# Patient Record
Sex: Male | Born: 1950 | Race: Black or African American | Hispanic: No | Marital: Married | Smoking: Former smoker
Health system: Southern US, Community
[De-identification: ages and names within clinical notes are randomized; demographics above are authoritative.]

## PROBLEM LIST (undated history)

## (undated) DIAGNOSIS — E782 Mixed hyperlipidemia: Secondary | ICD-10-CM

## (undated) DIAGNOSIS — T884XXA Failed or difficult intubation, initial encounter: Secondary | ICD-10-CM

## (undated) DIAGNOSIS — D649 Anemia, unspecified: Secondary | ICD-10-CM

## (undated) DIAGNOSIS — I10 Essential (primary) hypertension: Secondary | ICD-10-CM

## (undated) DIAGNOSIS — R739 Hyperglycemia, unspecified: Secondary | ICD-10-CM

## (undated) DIAGNOSIS — K635 Polyp of colon: Secondary | ICD-10-CM

## (undated) DIAGNOSIS — H409 Unspecified glaucoma: Secondary | ICD-10-CM

## (undated) HISTORY — DX: Mixed hyperlipidemia: E78.2

## (undated) HISTORY — DX: Essential (primary) hypertension: I10

## (undated) HISTORY — PX: NO PAST SURGERIES: SHX2092

## (undated) HISTORY — DX: Polyp of colon: K63.5

## (undated) HISTORY — DX: Hyperglycemia, unspecified: R73.9

## (undated) HISTORY — DX: Unspecified glaucoma: H40.9

---

## 2005-05-17 ENCOUNTER — Ambulatory Visit: Payer: Self-pay | Admitting: Gastroenterology

## 2009-03-27 ENCOUNTER — Ambulatory Visit: Payer: Self-pay | Admitting: Orthopedic Surgery

## 2009-06-01 ENCOUNTER — Ambulatory Visit: Payer: Self-pay | Admitting: Pain Medicine

## 2009-06-21 ENCOUNTER — Ambulatory Visit: Payer: Self-pay | Admitting: Pain Medicine

## 2009-06-27 ENCOUNTER — Ambulatory Visit: Payer: Self-pay | Admitting: Pain Medicine

## 2009-07-11 ENCOUNTER — Ambulatory Visit: Payer: Self-pay | Admitting: Pain Medicine

## 2012-07-17 ENCOUNTER — Ambulatory Visit: Payer: Self-pay | Admitting: Physician Assistant

## 2012-07-18 ENCOUNTER — Ambulatory Visit: Payer: Self-pay | Admitting: Physician Assistant

## 2012-08-18 ENCOUNTER — Ambulatory Visit: Payer: Self-pay | Admitting: Physician Assistant

## 2012-10-29 ENCOUNTER — Ambulatory Visit: Payer: Self-pay | Admitting: Specialist

## 2014-03-25 ENCOUNTER — Ambulatory Visit: Payer: Self-pay | Admitting: Gastroenterology

## 2014-09-12 LAB — SURGICAL PATHOLOGY

## 2015-05-29 ENCOUNTER — Ambulatory Visit: Payer: Self-pay

## 2015-06-01 ENCOUNTER — Ambulatory Visit (INDEPENDENT_AMBULATORY_CARE_PROVIDER_SITE_OTHER): Payer: Medicare HMO | Admitting: Urology

## 2015-06-01 ENCOUNTER — Encounter: Payer: Self-pay | Admitting: Urology

## 2015-06-01 ENCOUNTER — Other Ambulatory Visit: Payer: Self-pay

## 2015-06-01 VITALS — BP 126/84 | HR 69 | Ht 68.0 in | Wt 270.1 lb

## 2015-06-01 DIAGNOSIS — K635 Polyp of colon: Secondary | ICD-10-CM

## 2015-06-01 DIAGNOSIS — H409 Unspecified glaucoma: Secondary | ICD-10-CM | POA: Insufficient documentation

## 2015-06-01 DIAGNOSIS — R972 Elevated prostate specific antigen [PSA]: Secondary | ICD-10-CM

## 2015-06-01 DIAGNOSIS — R739 Hyperglycemia, unspecified: Secondary | ICD-10-CM

## 2015-06-01 DIAGNOSIS — E782 Mixed hyperlipidemia: Secondary | ICD-10-CM

## 2015-06-01 DIAGNOSIS — I10 Essential (primary) hypertension: Secondary | ICD-10-CM

## 2015-06-01 HISTORY — DX: Unspecified glaucoma: H40.9

## 2015-06-01 HISTORY — DX: Essential (primary) hypertension: I10

## 2015-06-01 HISTORY — DX: Mixed hyperlipidemia: E78.2

## 2015-06-01 HISTORY — DX: Polyp of colon: K63.5

## 2015-06-01 HISTORY — DX: Hyperglycemia, unspecified: R73.9

## 2015-06-01 NOTE — Progress Notes (Signed)
06/01/2015 3:34 PM   Ronald Chang [birth year] [id number]  Referring provider: No referring provider defined for this encounter.  Chief Complaint  Patient presents with  . New Patient (Initial Visit)    elevated psa    HPI: The patient is a 65 year old male who presents for evaluation of an elevated PSA.  It is currently 4.18 and has doubled over the last year.  According to notes from his primary care office, his PSA was 0.9 only a few years ago. He has no urinary complaints at this time. He has never had a kidney stone. He is never seen urologist before.   December 2016: 4.06 March 2014: 2.64   PMH: Past Medical History  Diagnosis Date  . BP (high blood pressure) 06/01/2015  . Colon polyp 06/01/2015  . Blood glucose elevated 06/01/2015  . 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 Glaucoma 06/01/2015    Surgical History: Past Surgical History  Procedure Laterality Date  . No past surgeries      Home Medications:    Medication List       This list is accurate as of: 06/01/15  3:34 PM.  Always use your most recent med list.               aspirin EC 81 MG tablet  Take by mouth.     benzonatate 200 MG capsule  Commonly known as:  TESSALON  Reported on 06/01/2015     cefdinir 300 MG capsule  Commonly known as:  OMNICEF  Reported on 06/01/2015     CHERATUSSIN AC 100-10 MG/5ML syrup  Generic drug:  guaiFENesin-codeine  Reported on 06/01/2015     ferrous sulfate 325 (65 FE) MG tablet  Take by mouth.     Fish Oil 1000 MG Caps  Take by mouth.     Garlic 705 MG Caps  Take by mouth.     latanoprost 0.005 % ophthalmic solution  Commonly known as:  XALATAN  Apply to eye. Reported on 06/01/2015     losartan-hydrochlorothiazide 100-12.5 MG tablet  Commonly known as:  HYZAAR  Take by mouth.     MULTI-VITAMINS Tabs  Take by mouth.     predniSONE 10 MG tablet  Commonly known as:  DELTASONE  Reported on 06/01/2015     PROAIR HFA 108  (90 Base) MCG/ACT inhaler  Generic drug:  albuterol  Reported on 06/01/2015        Allergies: No Known Allergies  Family History: Family History  Problem Relation Age of Onset  . Prostate cancer Neg Hx   . Bladder Cancer Neg Hx   . Kidney cancer Neg Hx     Social History:  reports that he quit smoking about 26 years ago. He does not have any smokeless tobacco history on file. He reports that he does not drink alcohol or use illicit drugs.  ROS: UROLOGY Frequent Urination?: No Hard to postpone urination?: No Burning/pain with urination?: No Get up at night to urinate?: No Leakage of urine?: No Urine stream starts and stops?: No Trouble starting stream?: No Do you have to strain to urinate?: No Blood in urine?: No Urinary tract infection?: No Sexually transmitted disease?: No Injury to kidneys or bladder?: No Painful intercourse?: No Weak stream?: No Erection problems?: No Penile pain?: No  Gastrointestinal Nausea?: No Vomiting?: No Indigestion/heartburn?: No Diarrhea?: No Constipation?: No  Constitutional Fever: No Night sweats?: No Weight loss?: No Fatigue?: No  Skin Skin rash/lesions?: Yes Itching?: No  Eyes Blurred vision?: No Double vision?: No  Ears/Nose/Throat Sore throat?: No Sinus problems?: No  Hematologic/Lymphatic Swollen glands?: No Easy bruising?: No  Cardiovascular Leg swelling?: No Chest pain?: No  Respiratory Cough?: No Shortness of breath?: No  Endocrine Excessive thirst?: No  Musculoskeletal Back pain?: No Joint pain?: No  Neurological Headaches?: No Dizziness?: No  Psychologic Depression?: No Anxiety?: No  Physical Exam: BP 126/84 mmHg  Pulse 69  Ht 5\' 8"  (1.727 m)  Wt 270 lb 1.6 oz (122.517 kg)  BMI 41.08 kg/m2  Constitutional:  Alert and oriented, No acute distress. HEENT: [place] AT, moist mucus membranes.  Trachea midline, no masses. Cardiovascular: No clubbing, cyanosis, or edema. Respiratory: Normal  respiratory effort, no increased work of breathing. GI: Abdomen is soft, nontender, nondistended, no abdominal masses GU: No CVA tenderness. Normal uncircumcised phallus. Testicles descended bilaterally. DRE: Prostate difficult to palpate secondary to body habitus Skin: No rashes, bruises or suspicious lesions. Lymph: No cervical or inguinal adenopathy. Neurologic: Grossly intact, no focal deficits, moving all 4 extremities. Psychiatric: Normal mood and affect.  Laboratory Data: No results found for: WBC, HGB, HCT, MCV, PLT  No results found for: CREATININE  No results found for: PSA  No results found for: TESTOSTERONE  No results found for: HGBA1C  Urinalysis No results found for: COLORURINE, APPEARANCEUR, LABSPEC, PHURINE, GLUCOSEU, HGBUR, BILIRUBINUR, KETONESUR, PROTEINUR, UROBILINOGEN, NITRITE, LEUKOCYTESUR   Assessment & Plan:    I discussed PSA testing in great detail with the patient. He understands that is a controversial tests as it may lead to the over diagnosis in over treatment of prostate cancer. He understands the next step with an elevated PSA is to undergo a prostate biopsy. He understands the risks of this biopsy including bleeding, infection, and over diagnosis of prostate cancer. He knows to expect blood in his urine, semen, and stool.   1. Elevated PSA -Recheck PSA to confirm elevation -Tentatively schedule prostate biopsy for 1-2 weeks  Hildred Laser, MD  [facility] [address] [place], [place] [address] [phone]

## 2015-06-02 LAB — PSA: Prostate Specific Ag, Serum: 3.1 ng/mL (ref 0.0–4.0)

## 2015-06-22 ENCOUNTER — Other Ambulatory Visit: Payer: Self-pay | Admitting: Urology

## 2015-06-22 ENCOUNTER — Ambulatory Visit (INDEPENDENT_AMBULATORY_CARE_PROVIDER_SITE_OTHER): Payer: Medicare HMO | Admitting: Urology

## 2015-06-22 VITALS — BP 146/81 | HR 76 | Ht 68.0 in | Wt 265.0 lb

## 2015-06-22 DIAGNOSIS — R972 Elevated prostate specific antigen [PSA]: Secondary | ICD-10-CM | POA: Diagnosis not present

## 2015-06-22 MED ORDER — LEVOFLOXACIN 500 MG PO TABS
500.0000 mg | ORAL_TABLET | Freq: Once | ORAL | Status: AC
  Administered 2015-06-22: 500 mg via ORAL

## 2015-06-22 MED ORDER — GENTAMICIN SULFATE 40 MG/ML IJ SOLN
80.0000 mg | Freq: Once | INTRAMUSCULAR | Status: AC
  Administered 2015-06-22: 80 mg via INTRAMUSCULAR

## 2015-06-22 NOTE — Addendum Note (Signed)
Addended by: Catalina Lunger on: 06/22/2015 04:39 PM   Modules accepted: Level of Service

## 2015-06-22 NOTE — Progress Notes (Signed)
Prostate Biopsy Procedure   Informed consent was obtained after discussing risks/benefits of the procedure.  A time out was performed to ensure correct patient identity.  Pre-Procedure: - Last PSA Level:  January 2017: 3.20 April 2015: 4.06 March 2014: 2.64 2014: 0.9 - Gentamicin given prophylactically - Levaquin 500 mg administered PO -Transrectal Ultrasound performed revealing a 68 gm prostate -No significant hypoechoic or median lobe noted  Procedure: - Prostate block performed using 10 cc 1% lidocaine and biopsies taken from sextant areas, a total of 12 under ultrasound guidance.  Post-Procedure: - Patient tolerated the procedure well - He was counseled to seek immediate medical attention if experiences any severe pain, significant bleeding, or fevers - Return in one week to discuss biopsy results

## 2015-06-28 LAB — PATHOLOGY REPORT

## 2015-06-29 ENCOUNTER — Ambulatory Visit: Payer: Medicare HMO | Admitting: Urology

## 2015-07-03 ENCOUNTER — Encounter: Payer: Self-pay | Admitting: Urology

## 2015-12-20 ENCOUNTER — Other Ambulatory Visit: Payer: Medicare HMO

## 2015-12-20 DIAGNOSIS — R972 Elevated prostate specific antigen [PSA]: Secondary | ICD-10-CM

## 2015-12-21 LAB — PSA: PROSTATE SPECIFIC AG, SERUM: 3.6 ng/mL (ref 0.0–4.0)

## 2015-12-27 ENCOUNTER — Ambulatory Visit (INDEPENDENT_AMBULATORY_CARE_PROVIDER_SITE_OTHER): Payer: Medicare HMO | Admitting: Urology

## 2015-12-27 ENCOUNTER — Encounter: Payer: Self-pay | Admitting: Urology

## 2015-12-27 VITALS — BP 127/73 | HR 66 | Ht 68.0 in | Wt 279.1 lb

## 2015-12-27 DIAGNOSIS — N471 Phimosis: Secondary | ICD-10-CM

## 2015-12-27 DIAGNOSIS — R972 Elevated prostate specific antigen [PSA]: Secondary | ICD-10-CM | POA: Diagnosis not present

## 2015-12-27 NOTE — Progress Notes (Signed)
12/27/2015 9:02 AM   Ronald Chang 18-Aug-1950 [id number]  Referring provider: Patrice ParadiseMiriam K McLaughlin, MD [address] [facility] [place] [place] [place] [id number]  Chief Complaint  Patient presents with  . Follow-up    elevated PSA    HPI: The patient is a 65 year old gentleman with a past medical history of an elevated PSA and a negative prostate biopsy in February 2017 presents today for repeat PSA. His PSA is currently 3.1. He has no complaints at this time.  August 2017: 3.26 May 2015: 3.20 April 2015: 4.06 March 2014: 2.64    He also brought up today that he is interested in a circumcision. He is able to retract his foreskin however he feels that it is time to undergo circumcision. His brother recently had a circumcision. He requested this operation at this time.   PMH: Past Medical History:  Diagnosis Date  . Blood glucose elevated 06/01/2015  . BP (high blood pressure) 06/01/2015  . Colon polyp 06/01/2015  . 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 Glaucoma 06/01/2015    Surgical History: Past Surgical History:  Procedure Laterality Date  . NO PAST SURGERIES      Home Medications:    Medication List       Accurate as of 12/27/15  9:02 AM. Always use your most recent med list.          aspirin EC 81 MG tablet Take by mouth. Reported on 06/22/2015   ferrous sulfate 325 (65 FE) MG tablet Take by mouth.   Fish Oil 1000 MG Caps Take by mouth.   Garlic 705 MG Caps Take by mouth.   latanoprost 0.005 % ophthalmic solution Commonly known as:  XALATAN Apply to eye. Reported on 06/01/2015   losartan-hydrochlorothiazide 100-12.5 MG tablet Commonly known as:  HYZAAR Take by mouth.   MULTI-VITAMINS Tabs Take by mouth.   PROAIR HFA 108 (90 Base) MCG/ACT inhaler Generic drug:  albuterol Reported on 06/01/2015       Allergies: No Known Allergies  Family History: Family History  Problem Relation Age of Onset  .  Prostate cancer Neg Hx   . Bladder Cancer Neg Hx   . Kidney cancer Neg Hx     Social History:  reports that he quit smoking about 26 years ago. He does not have any smokeless tobacco history on file. He reports that he does not drink alcohol or use drugs.  ROS: UROLOGY Frequent Urination?: No Hard to postpone urination?: No Burning/pain with urination?: No Get up at night to urinate?: No Leakage of urine?: No Urine stream starts and stops?: No Trouble starting stream?: No Do you have to strain to urinate?: No Blood in urine?: No Urinary tract infection?: No Sexually transmitted disease?: No Injury to kidneys or bladder?: No Painful intercourse?: No Weak stream?: No Erection problems?: No Penile pain?: No  Gastrointestinal Nausea?: No Vomiting?: No Indigestion/heartburn?: No Diarrhea?: No Constipation?: No  Constitutional Fever: No Night sweats?: No Weight loss?: No Fatigue?: No  Skin Skin rash/lesions?: No Itching?: No  Eyes Blurred vision?: No Double vision?: No  Ears/Nose/Throat Sore throat?: No Sinus problems?: No  Hematologic/Lymphatic Swollen glands?: No Easy bruising?: No  Cardiovascular Leg swelling?: No Chest pain?: No  Respiratory Cough?: No Shortness of breath?: No  Endocrine Excessive thirst?: No  Musculoskeletal Back pain?: No Joint pain?: No  Neurological Headaches?: No Dizziness?: No  Psychologic Depression?: No Anxiety?: No  Physical Exam: BP 127/73 (BP Location: Left Arm, Patient Position: Sitting,  Cuff Size: Large)   Pulse 66   Ht  (1.727 m)   Wt 279 lb 1.6 oz (126.6 kg)   BMI 42.44 kg/m   Constitutional:  Alert and oriented, No acute distress. HEENT: [place] AT, moist mucus membranes.  Trachea midline, no masses. Cardiovascular: No clubbing, cyanosis, or edema. Respiratory: Normal respiratory effort, no increased work of breathing. GI: Abdomen is soft, nontender, nondistended, no abdominal masses GU: No CVA  tenderness. Uncircumcised phallus. Minor phimosis. Color changes to the foreskin noted. Normal testicles descended equally bilaterally. Skin: No rashes, bruises or suspicious lesions. Lymph: No cervical or inguinal adenopathy. Neurologic: Grossly intact, no focal deficits, moving all 4 extremities. Psychiatric: Normal mood and affect.  Laboratory Data: No results found for: WBC, HGB, HCT, MCV, PLT  No results found for: CREATININE  No results found for: PSA  No results found for: TESTOSTERONE  No results found for: HGBA1C  Urinalysis No results found for: COLORURINE, APPEARANCEUR, LABSPEC, PHURINE, GLUCOSEU, HGBUR, BILIRUBINUR, KETONESUR, PROTEINUR, UROBILINOGEN, NITRITE, LEUKOCYTESUR    Assessment & Plan:    1. History of elevated PSA Repeat PSA/DRE in 6 months. If this is normal we can return to yearly screening.  2. Phimosis The patient is interested in undergoing a circumcision. We discussed the risks, benefits, and indications of this procedure. He understands the risks include but are not limited to bleeding, infection, poor cosmesis, and damage to surrounding structures. He understands that postoperatively he will have pain and swelling. He left able to have intercourse for at least 2 weeks. All questions were answered. The patient has elected to proceed with circumcision.  Hildred Laser, MD  [facility] [address], [address] [place], [place] [address] [phone]

## 2015-12-28 ENCOUNTER — Telehealth: Payer: Self-pay | Admitting: Radiology

## 2015-12-28 NOTE — Telephone Encounter (Signed)
Notified pt of surgery scheduled with Dr Sherryl BartersBudzyn on 01/24/16, pre-admit testing appt on 01/10/16 @11 :15 & to call day prior to surgery for arrival time to SDS. Advised pt per Lora PaulaMimi McLaughlin to hold ASA 81mg  beginning 01/17/16 & resume after surgery. Pt voices understanding.

## 2015-12-28 NOTE — Telephone Encounter (Signed)
LMOM. Need to notify pt of surgery information. 

## 2015-12-29 ENCOUNTER — Other Ambulatory Visit: Payer: Self-pay | Admitting: Radiology

## 2015-12-29 DIAGNOSIS — N471 Phimosis: Secondary | ICD-10-CM

## 2016-01-10 ENCOUNTER — Encounter
Admission: RE | Admit: 2016-01-10 | Discharge: 2016-01-10 | Disposition: A | Discharge status: Home / Self Care | Payer: Medicare HMO | Source: Ambulatory Visit | Attending: Urology | Admitting: Urology

## 2016-01-10 DIAGNOSIS — Z01812 Encounter for preprocedural laboratory examination: Secondary | ICD-10-CM | POA: Insufficient documentation

## 2016-01-10 DIAGNOSIS — I10 Essential (primary) hypertension: Secondary | ICD-10-CM | POA: Diagnosis not present

## 2016-01-10 DIAGNOSIS — Z01810 Encounter for preprocedural cardiovascular examination: Secondary | ICD-10-CM | POA: Insufficient documentation

## 2016-01-10 HISTORY — DX: Anemia, unspecified: D64.9

## 2016-01-10 LAB — CBC
HCT: 42.4 % (ref 40.0–52.0)
Hemoglobin: 13.8 g/dL (ref 13.0–18.0)
MCH: 27.3 pg (ref 26.0–34.0)
MCHC: 32.5 g/dL (ref 32.0–36.0)
MCV: 84.2 fL (ref 80.0–100.0)
Platelets: 193 K/uL (ref 150–440)
RBC: 5.04 MIL/uL (ref 4.40–5.90)
RDW: 14.7 % — ABNORMAL HIGH (ref 11.5–14.5)
WBC: 4.4 K/uL (ref 3.8–10.6)

## 2016-01-10 LAB — BASIC METABOLIC PANEL WITH GFR
Anion gap: 8 (ref 5–15)
BUN: 18 mg/dL (ref 6–20)
CO2: 24 mmol/L (ref 22–32)
Calcium: 9.4 mg/dL (ref 8.9–10.3)
Chloride: 107 mmol/L (ref 101–111)
Creatinine, Ser: 0.9 mg/dL (ref 0.61–1.24)
GFR calc Af Amer: >60 mL/min (ref >60)
GFR calc non Af Amer: >60 mL/min (ref >60)
Glucose, Bld: 95 mg/dL (ref 65–99)
Potassium: 3.8 mmol/L (ref 3.5–5.1)
Sodium: 139 mmol/L (ref 135–145)

## 2016-01-10 NOTE — Patient Instructions (Signed)
  Your procedure is scheduled on:January 24, 2016 (Wednesday) Report to Same Day Surgery 2nd floor Medical Mall To find out your arrival time please call [phone] between 1PM - 3PM on January 23, 2016 (Tuesday)  Remember: Instructions that are not followed completely may result in serious medical risk, up to and including death, or upon the discretion of your surgeon and anesthesiologist your surgery may need to be rescheduled.    _x___ 1. Do not eat food or drink liquids after midnight. No gum chewing or hard candies.     _x___ 2. No Alcohol for 24 hours before or after surgery.   _x___3. No Smoking for 24 prior to surgery.   ____  4. Bring all medications with you on the day of surgery if instructed.    __x__ 5. Notify your doctor if there is any change in your medical condition     (cold, fever, infections).     Do not wear jewelry, make-up, hairpins, clips or nail polish.  Do not wear lotions, powders, or perfumes. You may wear deodorant.  Do not shave 48 hours prior to surgery. Men may shave face and neck.  Do not bring valuables to the hospital.    [facility] is not responsible for any belongings or valuables.               Contacts, dentures or bridgework may not be worn into surgery.  Leave your suitcase in the car. After surgery it may be brought to your room.  For patients admitted to the hospital, discharge time is determined by your treatment team.   Patients discharged the day of surgery will not be allowed to drive home.    Please read over the following fact sheets that you were given:   [facility] Preparing for Surgery and or MRSA Information   ___ Take these medicines the morning of surgery with A SIP OF WATER:    1.   2.  3.  4.  5.  6.  ____ Fleet Enema (as directed)   ___ Use CHG Soap or sage wipes as directed on instruction sheet   ____ Use inhalers on the day of surgery and bring to hospital day of surgery  ____ Stop metformin 2 days  prior to surgery    ____ Take 1/2 of usual insulin dose the night before surgery and none on the morning of surgery           _x__ Stop aspirin or coumadin, or plavix (STOP ASPIRIN NOW)  _x__ Stop Anti-inflammatories such as Advil, Aleve, Ibuprofen, Motrin, Naproxen,          Naprosyn, Goodies powders or aspirin products. Ok to take Tylenol.   _x___ Stop supplements until after surgery.  (STOP VITAMIN C, FISH OIL, AND GARLIC NOW)  ____ Bring C-Pap to the hospital.

## 2016-01-24 ENCOUNTER — Encounter: Payer: Self-pay | Admitting: *Deleted

## 2016-01-24 ENCOUNTER — Ambulatory Visit: Payer: Medicare HMO | Admitting: Anesthesiology

## 2016-01-24 ENCOUNTER — Ambulatory Visit
Admission: RE | Admit: 2016-01-24 | Discharge: 2016-01-24 | Disposition: A | Discharge status: Home / Self Care | Payer: Medicare HMO | Source: Ambulatory Visit | Attending: Urology | Admitting: Urology

## 2016-01-24 ENCOUNTER — Encounter
Admission: RE | Disposition: A | Discharge status: Home / Self Care | Payer: Self-pay | Source: Ambulatory Visit | Attending: Urology

## 2016-01-24 DIAGNOSIS — N471 Phimosis: Secondary | ICD-10-CM | POA: Insufficient documentation

## 2016-01-24 DIAGNOSIS — E669 Obesity, unspecified: Secondary | ICD-10-CM | POA: Diagnosis not present

## 2016-01-24 DIAGNOSIS — Z87891 Personal history of nicotine dependence: Secondary | ICD-10-CM | POA: Insufficient documentation

## 2016-01-24 DIAGNOSIS — Z7982 Long term (current) use of aspirin: Secondary | ICD-10-CM | POA: Insufficient documentation

## 2016-01-24 DIAGNOSIS — Z6841 Body Mass Index (BMI) 40.0 and over, adult: Secondary | ICD-10-CM | POA: Diagnosis not present

## 2016-01-24 DIAGNOSIS — Z86010 Personal history of colonic polyps: Secondary | ICD-10-CM | POA: Diagnosis not present

## 2016-01-24 DIAGNOSIS — H409 Unspecified glaucoma: Secondary | ICD-10-CM | POA: Insufficient documentation

## 2016-01-24 DIAGNOSIS — Z79899 Other long term (current) drug therapy: Secondary | ICD-10-CM | POA: Insufficient documentation

## 2016-01-24 DIAGNOSIS — E785 Hyperlipidemia, unspecified: Secondary | ICD-10-CM | POA: Diagnosis not present

## 2016-01-24 DIAGNOSIS — I10 Essential (primary) hypertension: Secondary | ICD-10-CM | POA: Diagnosis not present

## 2016-01-24 HISTORY — PX: CIRCUMCISION: SHX1350

## 2016-01-24 HISTORY — DX: Failed or difficult intubation, initial encounter: T88.4XXA

## 2016-01-24 SURGERY — CIRCUMCISION, ADULT
Anesthesia: General | Site: Penis | Wound class: Clean Contaminated

## 2016-01-24 MED ORDER — MEPERIDINE HCL 25 MG/ML IJ SOLN: 6.2500 mg | INTRAMUSCULAR | Status: DC | PRN

## 2016-01-24 MED ORDER — FENTANYL CITRATE (PF) 100 MCG/2ML IJ SOLN: 25.0000 ug | INTRAMUSCULAR | Status: DC | PRN

## 2016-01-24 MED ORDER — FAMOTIDINE 20 MG PO TABS: 20.0000 mg | ORAL_TABLET | Freq: Once | ORAL | Status: DC

## 2016-01-24 MED ORDER — FENTANYL CITRATE (PF) 100 MCG/2ML IJ SOLN
INTRAMUSCULAR | Status: DC | PRN
  Administered 2016-01-24 (×2): 50 ug via INTRAVENOUS

## 2016-01-24 MED ORDER — LIDOCAINE HCL (CARDIAC) 20 MG/ML IV SOLN
INTRAVENOUS | Status: DC | PRN
  Administered 2016-01-24: 100 mg via INTRAVENOUS

## 2016-01-24 MED ORDER — OXYCODONE HCL 5 MG PO TABS: 5.0000 mg | ORAL_TABLET | Freq: Once | ORAL | Status: DC | PRN

## 2016-01-24 MED ORDER — HYDROCODONE-ACETAMINOPHEN 5-325 MG PO TABS: 1.0000 | ORAL_TABLET | ORAL | 0 refills | Status: DC | PRN

## 2016-01-24 MED ORDER — CEPHALEXIN 500 MG PO CAPS: 500.0000 mg | ORAL_CAPSULE | Freq: Three times a day (TID) | ORAL | 0 refills | Status: DC

## 2016-01-24 MED ORDER — OXYCODONE HCL 5 MG/5ML PO SOLN: 5.0000 mg | Freq: Once | ORAL | Status: DC | PRN

## 2016-01-24 MED ORDER — DEXAMETHASONE SODIUM PHOSPHATE 10 MG/ML IJ SOLN
INTRAMUSCULAR | Status: DC | PRN
  Administered 2016-01-24: 10 mg via INTRAVENOUS

## 2016-01-24 MED ORDER — SUCCINYLCHOLINE CHLORIDE 20 MG/ML IJ SOLN
INTRAMUSCULAR | Status: DC | PRN
  Administered 2016-01-24: 140 mg via INTRAVENOUS

## 2016-01-24 MED ORDER — PROMETHAZINE HCL 25 MG/ML IJ SOLN: 6.2500 mg | INTRAMUSCULAR | Status: DC | PRN

## 2016-01-24 MED ORDER — ONDANSETRON HCL 4 MG/2ML IJ SOLN
INTRAMUSCULAR | Status: DC | PRN
  Administered 2016-01-24: 4 mg via INTRAVENOUS

## 2016-01-24 MED ORDER — CEFAZOLIN SODIUM-DEXTROSE 2-4 GM/100ML-% IV SOLN
INTRAVENOUS | Status: AC
  Filled 2016-01-24: qty 100

## 2016-01-24 MED ORDER — MIDAZOLAM HCL 2 MG/2ML IJ SOLN
INTRAMUSCULAR | Status: DC | PRN
Start: 2016-01-24 — End: 2016-01-24
  Administered 2016-01-24: 2 mg via INTRAVENOUS

## 2016-01-24 MED ORDER — BUPIVACAINE HCL (PF) 0.5 % IJ SOLN
INTRAMUSCULAR | Status: AC
  Filled 2016-01-24: qty 30

## 2016-01-24 MED ORDER — FAMOTIDINE 20 MG PO TABS
ORAL_TABLET | ORAL | Status: AC
  Administered 2016-01-24: 20 mg
  Filled 2016-01-24: qty 1

## 2016-01-24 MED ORDER — CEFAZOLIN SODIUM-DEXTROSE 2-4 GM/100ML-% IV SOLN
2.0000 g | INTRAVENOUS | Status: AC
  Administered 2016-01-24: 2 g via INTRAVENOUS

## 2016-01-24 MED ORDER — BUPIVACAINE HCL (PF) 0.5 % IJ SOLN
INTRAMUSCULAR | Status: DC | PRN
  Administered 2016-01-24: 8 mL

## 2016-01-24 MED ORDER — PROPOFOL 10 MG/ML IV BOLUS
INTRAVENOUS | Status: DC | PRN
  Administered 2016-01-24: 100 mg via INTRAVENOUS
  Administered 2016-01-24: 200 mg via INTRAVENOUS

## 2016-01-24 MED ORDER — LACTATED RINGERS IV SOLN
INTRAVENOUS | Status: DC
  Administered 2016-01-24: 12:00:00 via INTRAVENOUS

## 2016-01-24 MED ORDER — SUGAMMADEX SODIUM 500 MG/5ML IV SOLN
INTRAVENOUS | Status: DC | PRN
  Administered 2016-01-24: 250 mg via INTRAVENOUS

## 2016-01-24 MED ORDER — ROCURONIUM BROMIDE 100 MG/10ML IV SOLN
INTRAVENOUS | Status: DC | PRN
  Administered 2016-01-24: 10 mg via INTRAVENOUS

## 2016-01-24 SURGICAL SUPPLY — 22 items
BLADE SURG 15 STRL LF DISP TIS (BLADE) ×1 IMPLANT
BLADE SURG 15 STRL SS (BLADE) ×2
CANISTER SUCT 1200ML W/VALVE (MISCELLANEOUS) ×3 IMPLANT
CHLORAPREP W/TINT 26ML (MISCELLANEOUS) ×3 IMPLANT
DRAPE LAPAROTOMY 77X122 PED (DRAPES) ×3 IMPLANT
ELECT REM PT RETURN 9FT ADLT (ELECTROSURGICAL) ×3
ELECTRODE REM PT RTRN 9FT ADLT (ELECTROSURGICAL) ×1 IMPLANT
GAUZE PETROLATUM 1 X8 (GAUZE/BANDAGES/DRESSINGS) ×3 IMPLANT
GAUZE STRETCH 2X75IN STRL (MISCELLANEOUS) ×3 IMPLANT
GLOVE BIO SURGEON STRL SZ7 (GLOVE) ×3 IMPLANT
GLOVE INDICATOR 7.5 STRL GRN (GLOVE) ×3 IMPLANT
GOWN STRL REUS W/ TWL LRG LVL3 (GOWN DISPOSABLE) ×1 IMPLANT
GOWN STRL REUS W/ TWL XL LVL3 (GOWN DISPOSABLE) ×1 IMPLANT
GOWN STRL REUS W/TWL LRG LVL3 (GOWN DISPOSABLE) ×2
GOWN STRL REUS W/TWL XL LVL3 (GOWN DISPOSABLE) ×2
KIT RM TURNOVER STRD PROC AR (KITS) ×3 IMPLANT
LABEL OR SOLS (LABEL) ×3 IMPLANT
NEEDLE HYPO 25X1 1.5 SAFETY (NEEDLE) ×3 IMPLANT
NS IRRIG 500ML POUR BTL (IV SOLUTION) ×3 IMPLANT
PACK BASIN MINOR ARMC (MISCELLANEOUS) ×3 IMPLANT
SUT CHROMIC 3 0 SH 27 (SUTURE) ×12 IMPLANT
SYRINGE 10CC LL (SYRINGE) ×3 IMPLANT

## 2016-01-24 NOTE — Interval H&P Note (Signed)
History and Physical Interval Note:  01/24/2016 12:00 PM  Ronald LintsRonald Chang  has presented today for surgery, with the diagnosis of PHIMOSIS  The various methods of treatment have been discussed with the patient and family. After consideration of risks, benefits and other options for treatment, the patient has consented to  Procedure(s): CIRCUMCISION ADULT (N/A) as a surgical intervention .  The patient's history has been reviewed, patient examined, no change in status, stable for surgery.  I have reviewed the patient's chart and labs.  Questions were answered to the patient's satisfaction.    RRR Lungs clear   Hildred LaserBrian James Charan Prieto

## 2016-01-24 NOTE — Anesthesia Procedure Notes (Signed)
Procedure Name: Intubation Date/Time: 01/24/2016 12:54 PM Performed by: Priscella MannPENWARDEN, AMY Pre-anesthesia Checklist: Patient identified, Emergency Drugs available, Suction available and Patient being monitored Patient Re-evaluated:Patient Re-evaluated prior to inductionOxygen Delivery Method: Circle system utilized Preoxygenation: Pre-oxygenation with 100% oxygen Intubation Type: IV induction Laryngoscope Size: Glidescope and 4 Grade View: Grade I Tube type: Oral Tube size: 7.5 mm Number of attempts: 2 Airway Equipment and Method: Rigid stylet Placement Confirmation: ETT inserted through vocal cords under direct vision,  positive ETCO2 and breath sounds checked- equal and bilateral Secured at: 23 cm Tube secured with: Tape Dental Injury: Teeth and Oropharynx as per pre-operative assessment  Difficulty Due To: Difficult Airway- due to anterior larynx and Difficult Airway- due to large tongue Future Recommendations: Recommend- induction with short-acting agent, and alternative techniques readily available

## 2016-01-24 NOTE — Op Note (Signed)
Date of procedure: 01/24/16  Preoperative diagnosis:  1. Phimosis   Postoperative diagnosis:  1. Phimosis   Procedure: 1. Circumcision-adult  Surgeon: Baruch Gouty, MD  Anesthesia: General  Complications: None  Intraoperative findings: The patient had a phimosis. He underwent a successful circumcision.  EBL: None  Specimens: None  Drains: None  Disposition: Stable to the postanesthesia care unit  Indication for procedure: The patient is a 65 y.o. male with phimosis presents after setting to undergo an elective circumcision.  After reviewing the management options for treatment, the patient elected to proceed with the above surgical procedure(s). We have discussed the potential benefits and risks of the procedure, side effects of the proposed treatment, the likelihood of the patient achieving the goals of the procedure, and any potential problems that might occur during the procedure or recuperation. Informed consent has been obtained.  Description of procedure: The patient was met in the preoperative area. All risks, benefits, and indications of the procedure were described in great detail. The patient consented to the procedure. Preoperative antibiotics were given. The patient was taken to the operative theater. General anesthesia was induced per the anesthesia service. The patient was then placed in the supine position and prepped and draped in the usual sterile fashion. A preoperative timeout was called.   The patient's foreskin was then reduced and the proximal and distal margins marked circumferentially. A 15 blade was used to incise along this to markings. Bovie cautery was then used to remove the foreskin. Hemostasis was obtained and was excellent. A 3-0 chromic was then placed at the 12 and 6:00 positions in interrupted fashion. This process was repeated at the 3 and 9:00 positions. After good approximation of the tissue, the remaining skin edges were reapproximated  circumferentially with 3-0 chromic. There is good cosmesis of the procedure. Vaseline gauze was placed around the incision followed by regular gauze. The patient was woke from anesthesia and transferred stable condition to the postanesthesia care unit.  Plan: The patient will follow up one month for a wound check. He also has a history of elevated PSA with a negative prostate biopsy. He is due for repeat PSA and DRE in 6 months. We will schedule his appointment at his follow-up visit.  Baruch Gouty, M.D.

## 2016-01-24 NOTE — Anesthesia Postprocedure Evaluation (Signed)
Anesthesia Post Note  Patient: Ronald Chang  Procedure(s) Performed: Procedure(s) (LRB): CIRCUMCISION ADULT (N/A)  Patient location during evaluation: PACU Anesthesia Type: General Level of consciousness: awake and alert Pain management: pain level controlled Vital Signs Assessment: post-procedure vital signs reviewed and stable Respiratory status: spontaneous breathing, nonlabored ventilation and respiratory function stable Cardiovascular status: blood pressure returned to baseline and stable Postop Assessment: no signs of nausea or vomiting Anesthetic complications: no    Last Vitals:  Vitals:   01/24/16 1338 01/24/16 1353  BP: 129/88 124/72  Pulse: 66 60  Resp: (!) 29 19  Temp: (!) 36 C     Last Pain:  Vitals:   01/24/16 1353  TempSrc:   PainSc: Asleep                 [name]

## 2016-01-24 NOTE — Transfer of Care (Signed)
Immediate Anesthesia Transfer of Care Note  Patient: Ronald Chang  Procedure(s) Performed: Procedure(s): CIRCUMCISION ADULT (N/A)  Patient Location: PACU  Anesthesia Type:General  Level of Consciousness: awake  Airway & Oxygen Therapy: Patient connected to face mask oxygen  Post-op Assessment: Report given to RN  Post vital signs: stable  Last Vitals:  Vitals:   01/24/16 1121 01/24/16 1338  BP: (!) 134/97 129/88  Pulse: 63 66  Resp: 18 (!) 29  Temp: 36.2 C (!) 36 C    Last Pain:  Vitals:   01/24/16 1338  TempSrc: Temporal         Complications: No apparent anesthesia complications

## 2016-01-24 NOTE — Discharge Instructions (Signed)
You may return to work once you are off of your narcotic pain medications. You may take the dressing off in 48 hours.   Circumcision, Adult, Care After Refer to this sheet in the next few weeks. These instructions provide you with information on caring for yourself after your procedure. Your health care provider may also give you more specific instructions. Your treatment has been planned according to current medical practices, but problems sometimes occur. Call your health care provider if you have any problems or questions after your procedure. WHAT TO EXPECT AFTER THE PROCEDURE After your procedure, it is typical to have the following sensations:  Redness at the incision site.  Soreness at the incision site.  Slight swelling around the incision. HOME CARE INSTRUCTIONS  Take all medicines as directed by your health care provider.  Any dressings should stay on for at least 24 hours. You may take the dressing off at night to let air circulate around the incision. Once a scab has formed over the incision, you no longer need to cover your incision with a dressing.  Carefully remove the bandage if it gets dirty. Apply medicated cream to the incision. Carefully put a new bandage on if a scab has not formed.  Do not have sex until your health care provider says it is okay.  Do not get your incision site wet for 24 hours or as told by your health care provider.  You may take a sponge bath. Clean around the incision site gently with mild soap and water.  You may take a shower after 24 hours or as told by your health care provider. Do not take a tub bath. After you shower, gently pat dry the incision site. Do not rub it.  Avoid heavy lifting.  Avoid contact sports, biking, or swimming until you have healed. This will usually be between 10 days and 14 days after the procedure. SEEK MEDICAL CARE IF:  You are experiencing pain that is not relieved by your medicine.  You have swelling or  redness that is unexpected.  You develop a fever. SEEK IMMEDIATE MEDICAL CARE IF:  You cannot urinate.  You have pain when you urinate.  Your pain is not helped by medicines.  There is redness, swelling, and pain (inflammation) spreading up the shaft of your penis, thighs, or lower abdomen.  There is pus coming from your incision.  You have bleeding that does not stop when you press on it.   This information is not intended to replace advice given to you by your health care provider. Make sure you discuss any questions you have with your health care provider.   Document Released: 02/24/2013 Document Revised: 05/27/2014 Document Reviewed: 02/24/2013 Elsevier Interactive Patient Education 2016 Elsevier Inc.  General Anesthesia, Adult, Care After Refer to this sheet in the next few weeks. These instructions provide you with information on caring for yourself after your procedure. Your health care provider may also give you more specific instructions. Your treatment has been planned according to current medical practices, but problems sometimes occur. Call your health care provider if you have any problems or questions after your procedure. WHAT TO EXPECT AFTER THE PROCEDURE After the procedure, it is typical to experience:  Sleepiness.  Nausea and vomiting. HOME CARE INSTRUCTIONS  For the first 24 hours after general anesthesia:  Have a responsible person with you.  Do not drive a car. If you are alone, do not take public transportation.  Do not drink alcohol.  Do  not take medicine that has not been prescribed by your health care provider.  Do not sign important papers or make important decisions.  You may resume a normal diet and activities as directed by your health care provider.  Change bandages (dressings) as directed.  If you have questions or problems that seem related to general anesthesia, call the hospital and ask for the anesthetist or anesthesiologist on  call. SEEK MEDICAL CARE IF:  You have nausea and vomiting that continue the day after anesthesia.  You develop a rash. SEEK IMMEDIATE MEDICAL CARE IF:   You have difficulty breathing.  You have chest pain.  You have any allergic problems.   This information is not intended to replace advice given to you by your health care provider. Make sure you discuss any questions you have with your health care provider.   Document Released: 08/12/2000 Document Revised: 05/27/2014 Document Reviewed: 09/04/2011 Elsevier Interactive Patient Education [employer]

## 2016-01-24 NOTE — H&P (View-Only) (Signed)
12/27/2015 9:02 AM   Ronald Chang 18-Aug-1950 [id number]  Referring provider: Patrice ParadiseMiriam K McLaughlin, MD [address] [facility] [place] [place], [place] [id number]  Chief Complaint  Patient presents with  . Follow-up    elevated PSA    HPI: The patient is a 65 year old gentleman with a past medical history of an elevated PSA and a negative prostate biopsy in February 2017 presents today for repeat PSA. His PSA is currently 3.1. He has no complaints at this time.  August 2017: 3.26 May 2015: 3.20 April 2015: 4.06 March 2014: 2.64    He also brought up today that he is interested in a circumcision. He is able to retract his foreskin however he feels that it is time to undergo circumcision. His brother recently had a circumcision. He requested this operation at this time.   PMH: Past Medical History:  Diagnosis Date  . Blood glucose elevated 06/01/2015  . BP (high blood pressure) 06/01/2015  . Colon polyp 06/01/2015  . 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 Glaucoma 06/01/2015    Surgical History: Past Surgical History:  Procedure Laterality Date  . NO PAST SURGERIES      Home Medications:    Medication List       Accurate as of 12/27/15  9:02 AM. Always use your most recent med list.          aspirin EC 81 MG tablet Take by mouth. Reported on 06/22/2015   ferrous sulfate 325 (65 FE) MG tablet Take by mouth.   Fish Oil 1000 MG Caps Take by mouth.   Garlic 705 MG Caps Take by mouth.   latanoprost 0.005 % ophthalmic solution Commonly known as:  XALATAN Apply to eye. Reported on 06/01/2015   losartan-hydrochlorothiazide 100-12.5 MG tablet Commonly known as:  HYZAAR Take by mouth.   MULTI-VITAMINS Tabs Take by mouth.   PROAIR HFA 108 (90 Base) MCG/ACT inhaler Generic drug:  albuterol Reported on 06/01/2015       Allergies: No Known Allergies  Family History: Family History  Problem Relation Age of Onset  .  Prostate cancer Neg Hx   . Bladder Cancer Neg Hx   . Kidney cancer Neg Hx     Social History:  reports that he quit smoking about 26 years ago. He does not have any smokeless tobacco history on file. He reports that he does not drink alcohol or use drugs.  ROS: UROLOGY Frequent Urination?: No Hard to postpone urination?: No Burning/pain with urination?: No Get up at night to urinate?: No Leakage of urine?: No Urine stream starts and stops?: No Trouble starting stream?: No Do you have to strain to urinate?: No Blood in urine?: No Urinary tract infection?: No Sexually transmitted disease?: No Injury to kidneys or bladder?: No Painful intercourse?: No Weak stream?: No Erection problems?: No Penile pain?: No  Gastrointestinal Nausea?: No Vomiting?: No Indigestion/heartburn?: No Diarrhea?: No Constipation?: No  Constitutional Fever: No Night sweats?: No Weight loss?: No Fatigue?: No  Skin Skin rash/lesions?: No Itching?: No  Eyes Blurred vision?: No Double vision?: No  Ears/Nose/Throat Sore throat?: No Sinus problems?: No  Hematologic/Lymphatic Swollen glands?: No Easy bruising?: No  Cardiovascular Leg swelling?: No Chest pain?: No  Respiratory Cough?: No Shortness of breath?: No  Endocrine Excessive thirst?: No  Musculoskeletal Back pain?: No Joint pain?: No  Neurological Headaches?: No Dizziness?: No  Psychologic Depression?: No Anxiety?: No  Physical Exam: BP 127/73 (BP Location: Left Arm, Patient Position: Sitting,  Cuff Size: Large)   Pulse 66   Ht 5\' 8"  (1.727 m)   Wt 279 lb 1.6 oz (126.6 kg)   BMI 42.44 kg/m   Constitutional:  Alert and oriented, No acute distress. HEENT: [place] AT, moist mucus membranes.  Trachea midline, no masses. Cardiovascular: No clubbing, cyanosis, or edema. Respiratory: Normal respiratory effort, no increased work of breathing. GI: Abdomen is soft, nontender, nondistended, no abdominal masses GU: No CVA  tenderness. Uncircumcised phallus. Minor phimosis. Color changes to the foreskin noted. Normal testicles descended equally bilaterally. Skin: No rashes, bruises or suspicious lesions. Lymph: No cervical or inguinal adenopathy. Neurologic: Grossly intact, no focal deficits, moving all 4 extremities. Psychiatric: Normal mood and affect.  Laboratory Data: No results found for: WBC, HGB, HCT, MCV, PLT  No results found for: CREATININE  No results found for: PSA  No results found for: TESTOSTERONE  No results found for: HGBA1C  Urinalysis No results found for: COLORURINE, APPEARANCEUR, LABSPEC, PHURINE, GLUCOSEU, HGBUR, BILIRUBINUR, KETONESUR, PROTEINUR, UROBILINOGEN, NITRITE, LEUKOCYTESUR    Assessment & Plan:    1. History of elevated PSA Repeat PSA/DRE in 6 months. If this is normal we can return to yearly screening.  2. Phimosis The patient is interested in undergoing a circumcision. We discussed the risks, benefits, and indications of this procedure. He understands the risks include but are not limited to bleeding, infection, poor cosmesis, and damage to surrounding structures. He understands that postoperatively he will have pain and swelling. He left able to have intercourse for at least 2 weeks. All questions were answered. The patient has elected to proceed with circumcision.  Hildred LaserBrian James Averie Hornbaker, MD  [facility] [address], [address] [place], [place] [address] [phone]

## 2016-01-24 NOTE — Anesthesia Procedure Notes (Deleted)
Performed by: Irving BurtonBACHICH, Germani Gavilanes

## 2016-01-24 NOTE — Anesthesia Preprocedure Evaluation (Signed)
Anesthesia Evaluation  Patient identified by MRN, date of birth, ID band Patient awake    Reviewed: Allergy & Precautions, NPO status , Patient's Chart, lab work & pertinent test results  History of Anesthesia Complications Negative for: history of anesthetic complications  Airway Mallampati: II  TM Distance: >3 FB Neck ROM: Full    Dental no notable dental hx.    Pulmonary neg sleep apnea, neg COPD, former smoker,    breath sounds clear to auscultation- rhonchi (-) wheezing      Cardiovascular Exercise Tolerance: Good hypertension, Pt. on medications (-) CAD and (-) Past MI  Rhythm:Regular Rate:Normal - Systolic murmurs and - Diastolic murmurs    Neuro/Psych negative neurological ROS  negative psych ROS   GI/Hepatic negative GI ROS, Neg liver ROS,   Endo/Other  negative endocrine ROSneg diabetes  Renal/GU negative Renal ROS     Musculoskeletal negative musculoskeletal ROS (+)   Abdominal (+) + obese,   Peds  Hematology negative hematology ROS (+)   Anesthesia Other Findings Past Medical History: No date: Anemia 06/01/2015: Blood glucose elevated 06/01/2015: BP (high blood pressure) 06/01/2015: Colon polyp 06/01/2015: Combined fat and carbohydrate induced hyperlip* 06/01/2015: Glaucoma   Reproductive/Obstetrics                             Anesthesia Physical Anesthesia Plan  ASA: II  Anesthesia Plan: General   Post-op Pain Management:    Induction: Intravenous  Airway Management Planned: LMA  Additional Equipment:   Intra-op Plan:   Post-operative Plan:   Informed Consent: I have reviewed the patients History and Physical, chart, labs and discussed the procedure including the risks, benefits and alternatives for the proposed anesthesia with the patient or authorized representative who has indicated his/her understanding and acceptance.   Dental advisory given  Plan  Discussed with: CRNA and Anesthesiologist  Anesthesia Plan Comments:         Anesthesia Quick Evaluation

## 2016-02-23 ENCOUNTER — Ambulatory Visit (INDEPENDENT_AMBULATORY_CARE_PROVIDER_SITE_OTHER): Payer: Medicare HMO | Admitting: Urology

## 2016-02-23 ENCOUNTER — Encounter: Payer: Self-pay | Admitting: Urology

## 2016-02-23 VITALS — BP 123/78 | HR 63 | Ht 68.0 in | Wt 282.9 lb

## 2016-02-23 DIAGNOSIS — N471 Phimosis: Secondary | ICD-10-CM

## 2016-02-23 DIAGNOSIS — R972 Elevated prostate specific antigen [PSA]: Secondary | ICD-10-CM | POA: Diagnosis not present

## 2016-02-23 NOTE — Progress Notes (Signed)
02/23/2016 11:13 AM   Ronald Chang 1950/10/21 [id number]  Referring provider: Patrice Paradise, MD [address] [facility] [place] [address]  Chief Complaint  Patient presents with  . Follow-up    post-op circumcision     HPI: The patient is a 65 year old gentleman presents today for postoperative follow-up.  1. Phimosis The patient presented with a circumcision. He is doing well at this time. His pain is well controlled. His incision is healed well. He said no infections. Next  2. History of Elevated PSA   History of an elevated PSA and a negative prostate biopsy in February 2017 presents today for repeat PSA. His PSA is currently 3.1. He has no complaints at this time.  August 2017: 3.26 May 2015: 3.20 April 2015: 4.06 March 2014: 2.64    PMH: Past Medical History:  Diagnosis Date  . Anemia   . Blood glucose elevated 06/01/2015  . BP (high blood pressure) 06/01/2015  . Colon polyp 06/01/2015  . 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 Difficult intubation    Grade IV view with MAC 4, difficult 2 person mask ventilation, grade 1 view with glidescope, anterior larynx with several attempts and anterior laryngeal pressure required to pass tube through cords even with glidescope stylet  . Glaucoma 06/01/2015    Surgical History: Past Surgical History:  Procedure Laterality Date  . CIRCUMCISION N/A 01/24/2016   Procedure: CIRCUMCISION ADULT;  Surgeon: Hildred Laser, MD;  Location: [facility] ORS;  Service: Urology;  Laterality: N/A;  . NO PAST SURGERIES      Home Medications:    Medication List       Accurate as of 02/23/16 11:13 AM. Always use your most recent med list.          aspirin EC 81 MG tablet Take 81 mg by mouth daily. Reported on 06/22/2015   cephALEXin 500 MG capsule Commonly known as:  KEFLEX Take 1 capsule (500 mg total) by mouth 3 (three) times daily.   ferrous sulfate 325 (65 FE) MG  tablet Take 325 mg by mouth daily.   Fish Oil 1000 MG Caps Take 1 capsule by mouth daily.   Garlic 705 MG Caps Take 1 capsule by mouth daily.   HYDROcodone-acetaminophen 5-325 MG tablet Commonly known as:  NORCO Take 1 tablet by mouth every 4 (four) hours as needed for moderate pain.   latanoprost 0.005 % ophthalmic solution Commonly known as:  XALATAN Place 1 drop into both eyes at bedtime. Reported on 06/01/2015   losartan-hydrochlorothiazide 100-12.5 MG tablet Commonly known as:  HYZAAR Take 1 tablet by mouth daily.   MULTI-VITAMINS Tabs Take 1 tablet by mouth daily.   PROAIR HFA 108 (90 Base) MCG/ACT inhaler Generic drug:  albuterol Reported on 06/01/2015   vitamin C 1000 MG tablet Take 1,000 mg by mouth daily.       Allergies: No Known Allergies  Family History: Family History  Problem Relation Age of Onset  . Prostate cancer Neg Hx   . Bladder Cancer Neg Hx   . Kidney cancer Neg Hx     Social History:  reports that he quit smoking about 26 years ago. His smoking use included Pipe and Cigars. He has quit using smokeless tobacco. His smokeless tobacco use included Chew. He reports that he does not drink alcohol or use drugs.  ROS: UROLOGY Frequent Urination?: No Hard to postpone urination?: No Burning/pain with urination?: No Get up at night to urinate?:  No Leakage of urine?: No Urine stream starts and stops?: No Trouble starting stream?: No Do you have to strain to urinate?: No Blood in urine?: No Urinary tract infection?: No Sexually transmitted disease?: No Injury to kidneys or bladder?: No Painful intercourse?: No Weak stream?: No Erection problems?: No Penile pain?: No  Gastrointestinal Nausea?: No Vomiting?: No Indigestion/heartburn?: No Diarrhea?: No Constipation?: No  Constitutional Fever: No Night sweats?: No Weight loss?: No Fatigue?: No  Skin Skin rash/lesions?: No Itching?: No  Eyes Blurred vision?: No Double vision?:  No  Ears/Nose/Throat Sore throat?: No Sinus problems?: No  Hematologic/Lymphatic Swollen glands?: No Easy bruising?: No  Cardiovascular Leg swelling?: No Chest pain?: No  Respiratory Cough?: No Shortness of breath?: No  Endocrine Excessive thirst?: No  Musculoskeletal Back pain?: No Joint pain?: No  Neurological Headaches?: No Dizziness?: No  Psychologic Depression?: No Anxiety?: No  Physical Exam: BP 123/78   Pulse 63   Ht 5\' 8"  (1.727 m)   Wt 282 lb 14.4 oz (128.3 kg)   BMI 43.01 kg/m   Constitutional:  Alert and oriented, No acute distress. HEENT: [place] AT, moist mucus membranes.  Trachea midline, no masses. Cardiovascular: No clubbing, cyanosis, or edema. Respiratory: Normal respiratory effort, no increased work of breathing. GI: Abdomen is soft, nontender, nondistended, no abdominal masses GU: No CVA tenderness. Circumcision healing well. Skin: No rashes, bruises or suspicious lesions. Lymph: No cervical or inguinal adenopathy. Neurologic: Grossly intact, no focal deficits, moving all 4 extremities. Psychiatric: Normal mood and affect.  Laboratory Data: Lab Results  Component Value Date   WBC 4.4 01/10/2016   HGB 13.8 01/10/2016   HCT 42.4 01/10/2016   MCV 84.2 01/10/2016   PLT 193 01/10/2016    Lab Results  Component Value Date   CREATININE 0.90 01/10/2016    No results found for: PSA  No results found for: TESTOSTERONE  No results found for: HGBA1C  Urinalysis No results found for: COLORURINE, APPEARANCEUR, LABSPEC, PHURINE, GLUCOSEU, HGBUR, BILIRUBINUR, KETONESUR, PROTEINUR, UROBILINOGEN, NITRITE, LEUKOCYTESUR    Assessment & Plan:    1. History of elevated PSA Repeat PSA/DRE in 6 months. If this is normal, we can return to annual screening.  2. Phimosis Doing well s/p circumcision  Return in about 6 months (around 08/23/2016) for with PSA prior.  Hildred LaserBrian James Viktor Philipp, MD  [facility] [address], [address] [place], [place] [address] [phone]

## 2016-08-15 ENCOUNTER — Other Ambulatory Visit: Payer: Medicare HMO

## 2016-08-15 DIAGNOSIS — R972 Elevated prostate specific antigen [PSA]: Secondary | ICD-10-CM

## 2016-08-16 ENCOUNTER — Other Ambulatory Visit: Payer: Medicare HMO

## 2016-08-16 LAB — PSA: Prostate Specific Ag, Serum: 3.4 ng/mL (ref 0.0–4.0)

## 2016-08-20 ENCOUNTER — Ambulatory Visit (INDEPENDENT_AMBULATORY_CARE_PROVIDER_SITE_OTHER): Payer: Medicare HMO | Admitting: Urology

## 2016-08-20 ENCOUNTER — Encounter: Payer: Self-pay | Admitting: Urology

## 2016-08-20 VITALS — BP 129/81 | HR 66 | Ht 68.0 in | Wt 279.8 lb

## 2016-08-20 DIAGNOSIS — R972 Elevated prostate specific antigen [PSA]: Secondary | ICD-10-CM

## 2016-08-20 NOTE — Progress Notes (Signed)
08/20/2016 2:05 PM   Ronald Chang 07-26-1950 [id number]  Referring provider: Marinda Elk, MD [facility] [place] [address]  Chief Complaint  Patient presents with  . Elevated PSA    HPI: The patient is a 66 year old gentleman who presents for 6 month follow-up of his PSA.  1. History of Elevated PSA   History of an elevated PSA and a negative prostate biopsy in February 2017 presents today for repeat PSA. His PSA is currently 3.4. He has no complaints at this time. IPSS:1/19 July 2016: 3.22 December 2015: 3.26 May 2015: 3.20 April 2015: 4.06 March 2014: 2.64     2. History of phimosis Underwent uneventful circumcision September 2017  PMH: Past Medical History:  Diagnosis Date  . Anemia   . Blood glucose elevated 06/01/2015  . BP (high blood pressure) 06/01/2015  . Colon polyp 06/01/2015  . 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 Difficult intubation    Grade IV view with MAC 4, difficult 2 person mask ventilation, grade 1 view with glidescope, anterior larynx with several attempts and anterior laryngeal pressure required to pass tube through cords even with glidescope stylet  . Glaucoma 06/01/2015    Surgical History: Past Surgical History:  Procedure Laterality Date  . CIRCUMCISION N/A 01/24/2016   Procedure: CIRCUMCISION ADULT;  Surgeon: Nickie Retort, MD;  Location: [facility] ORS;  Service: Urology;  Laterality: N/A;  . NO PAST SURGERIES      Home Medications:  Allergies as of 08/20/2016   No Known Allergies     Medication List       Accurate as of 08/20/16  2:05 PM. Always use your most recent med list.          aspirin EC 81 MG tablet Take 81 mg by mouth daily. Reported on 06/22/2015   ferrous sulfate 325 (65 FE) MG tablet Take 325 mg by mouth daily.   Fish Oil 1000 MG Caps Take 1 capsule by mouth daily.   Garlic 481 MG Caps Take 1 capsule by mouth daily.     HYDROcodone-acetaminophen 5-325 MG tablet Commonly known as:  NORCO Take 1 tablet by mouth every 4 (four) hours as needed for moderate pain.   latanoprost 0.005 % ophthalmic solution Commonly known as:  XALATAN Place 1 drop into both eyes at bedtime. Reported on 06/01/2015   losartan-hydrochlorothiazide 100-12.5 MG tablet Commonly known as:  HYZAAR Take 1 tablet by mouth daily.   MULTI-VITAMINS Tabs Take 1 tablet by mouth daily.   PROAIR HFA 108 (90 Base) MCG/ACT inhaler Generic drug:  albuterol Reported on 06/01/2015   vitamin C 1000 MG tablet Take 1,000 mg by mouth daily.       Allergies: No Known Allergies  Family History: Family History  Problem Relation Age of Onset  . Prostate cancer Neg Hx   . Bladder Cancer Neg Hx   . Kidney cancer Neg Hx     Social History:  reports that he quit smoking about 27 years ago. His smoking use included Pipe and Cigars. He has quit using smokeless tobacco. His smokeless tobacco use included Chew. He reports that he does not drink alcohol or use drugs.  ROS: UROLOGY Frequent Urination?: No Hard to postpone urination?: No Burning/pain with urination?: No Get up at night to urinate?: No Leakage of urine?: No Urine stream starts and stops?: No Trouble starting stream?: No Do you have to strain to urinate?: No Blood in urine?: No  Urinary tract infection?: No Sexually transmitted disease?: No Injury to kidneys or bladder?: No Painful intercourse?: No Weak stream?: No Erection problems?: No Penile pain?: No  Gastrointestinal Nausea?: No Vomiting?: No Indigestion/heartburn?: No Diarrhea?: No Constipation?: No  Constitutional Fever: No Night sweats?: No Weight loss?: No Fatigue?: No  Skin Skin rash/lesions?: No Itching?: No  Eyes Blurred vision?: No Double vision?: No  Ears/Nose/Throat Sore throat?: No Sinus problems?: No  Hematologic/Lymphatic Swollen glands?: No Easy bruising?: No  Cardiovascular Leg  swelling?: No Chest pain?: No  Respiratory Cough?: No Shortness of breath?: No  Endocrine Excessive thirst?: No  Musculoskeletal Back pain?: No Joint pain?: No  Neurological Headaches?: No Dizziness?: No  Psychologic Depression?: No Anxiety?: No  Physical Exam: BP 129/81 (BP Location: Left Arm, Patient Position: Sitting, Cuff Size: Large)   Pulse 66   Ht _0  (1.727 m)   Wt 279 lb 12.8 oz (126.9 kg)   BMI 42.54 kg/m   Constitutional:  Alert and oriented, No acute distress. HEENT: [place] AT, moist mucus membranes.  Trachea midline, no masses. Cardiovascular: No clubbing, cyanosis, or edema. Respiratory: Normal respiratory effort, no increased work of breathing. GI: Abdomen is soft, nontender, nondistended, no abdominal masses GU: No CVA tenderness. DRE: Difficult to palpate prostate due to body habitus. However, there were no palpable abnormalities on what was able to be examined. Skin: No rashes, bruises or suspicious lesions. Lymph: No cervical or inguinal adenopathy. Neurologic: Grossly intact, no focal deficits, moving all 4 extremities. Psychiatric: Normal mood and affect.  Laboratory Data: Lab Results  Component Value Date   WBC 4.4 01/10/2016   HGB 13.8 01/10/2016   HCT 42.4 01/10/2016   MCV 84.2 01/10/2016   PLT 193 01/10/2016    Lab Results  Component Value Date   CREATININE 0.90 01/10/2016    No results found for: PSA  No results found for: TESTOSTERONE  No results found for: HGBA1C  Urinalysis No results found for: COLORURINE, APPEARANCEUR, LABSPEC, PHURINE, GLUCOSEU, HGBUR, BILIRUBINUR, KETONESUR, PROTEINUR, UROBILINOGEN, NITRITE, LEUKOCYTESUR   Assessment & Plan:    1. History of elevated PSA The patient's PSA has been stable and within normal limits on 3 separate occasions over the last year to have. He Chang return to annual prostate cancer screening.   Return in about 1 year (around 08/20/2017) for PSA prior.  Nickie Retort,  MD  [facility] [address] [place], [place] [address] [phone]

## 2016-08-23 ENCOUNTER — Ambulatory Visit: Payer: Medicare HMO

## 2017-08-13 ENCOUNTER — Other Ambulatory Visit: Payer: Medicare HMO

## 2017-08-13 DIAGNOSIS — R972 Elevated prostate specific antigen [PSA]: Secondary | ICD-10-CM

## 2017-08-14 LAB — PSA: Prostate Specific Ag, Serum: 3.9 ng/mL (ref 0.0–4.0)

## 2017-08-21 ENCOUNTER — Ambulatory Visit: Payer: Medicare HMO

## 2017-09-25 ENCOUNTER — Ambulatory Visit: Payer: Medicare HMO | Admitting: Urology

## 2017-09-25 ENCOUNTER — Encounter: Payer: Self-pay | Admitting: Urology

## 2017-09-25 VITALS — BP 126/77 | HR 78 | Ht 68.0 in | Wt 274.0 lb

## 2017-09-25 DIAGNOSIS — R972 Elevated prostate specific antigen [PSA]: Secondary | ICD-10-CM | POA: Diagnosis not present

## 2017-09-25 NOTE — Progress Notes (Signed)
09/25/2017 3:05 PM   Rod Can 1950-09-03 [id number]  Referring provider: Marinda Elk, MD [facility] [place] [address]  Chief Complaint  Patient presents with  . Elevated PSA    HPI: The patient is a 67 year old gentleman who presents for follow-up for his history of elevated PSA .  1. History of Elevated PSA  History of an elevated PSA and a negative prostate biopsy in February 2017 presents today for repeat PSA. His PSA is currently 3.9.  Last year, he returned to annual prostate cancer screening because his PSA had returned to the normal range for an extended period of time.  March 2019: 3.26 July 2016: 3.22 December 2015: 3.26 May 2015: 3.20 April 2015: 4.06 March 2014: 2.64     2. History of phimosis Underwent uneventful circumcision September 2017  Today, the patient presents for annual follow-up.  He has no complaints at this time.  He states that he voids well and feels like he empties his bladder.  He denies nocturia, weak stream, and hesitancy.  He denies any recent nephrolithiasis, gross hematuria, or UTI.  Overall he has done well over the last year since he was last seen.  PMH: Past Medical History:  Diagnosis Date  . Anemia   . Blood glucose elevated 06/01/2015  . BP (high blood pressure) 06/01/2015  . Colon polyp 06/01/2015  . 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 Difficult intubation    Grade IV view with MAC 4, difficult 2 person mask ventilation, grade 1 view with glidescope, anterior larynx with several attempts and anterior laryngeal pressure required to pass tube through cords even with glidescope stylet  . Glaucoma 06/01/2015    Surgical History: Past Surgical History:  Procedure Laterality Date  . CIRCUMCISION N/A 01/24/2016   Procedure: CIRCUMCISION ADULT;  Surgeon: Nickie Retort, MD;  Location: [facility] ORS;  Service: Urology;  Laterality: N/A;  . NO PAST  SURGERIES      Home Medications:  Allergies as of 09/25/2017   No Known Allergies     Medication List        Accurate as of 09/25/17  3:05 PM. Always use your most recent med list.          aspirin EC 81 MG tablet Take 81 mg by mouth daily. Reported on 06/22/2015   ferrous sulfate 325 (65 FE) MG tablet Take 325 mg by mouth daily.   Fish Oil 1000 MG Caps Take 1 capsule by mouth daily.   Garlic 952 MG Caps Take 1 capsule by mouth daily.   losartan-hydrochlorothiazide 100-12.5 MG tablet Commonly known as:  HYZAAR Take 1 tablet by mouth daily.   vitamin C 1000 MG tablet Take 1,000 mg by mouth daily.       Allergies: No Known Allergies  Family History: Family History  Problem Relation Age of Onset  . Prostate cancer Neg Hx   . Bladder Cancer Neg Hx   . Kidney cancer Neg Hx     Social History:  reports that he quit smoking about 28 years ago. His smoking use included pipe and cigars. He has quit using smokeless tobacco. His smokeless tobacco use included chew. He reports that he does not drink alcohol or use drugs.  ROS: UROLOGY Frequent Urination?: No Hard to postpone urination?: No Burning/pain with urination?: No Get up at night to urinate?: No Leakage of urine?: No Urine stream starts and stops?: No Trouble starting stream?: No Do you  have to strain to urinate?: No Blood in urine?: No Urinary tract infection?: No Sexually transmitted disease?: No Injury to kidneys or bladder?: No Painful intercourse?: No Weak stream?: No Erection problems?: No Penile pain?: No  Gastrointestinal Nausea?: No Vomiting?: No Indigestion/heartburn?: No Diarrhea?: No Constipation?: No  Constitutional Fever: No Night sweats?: No Weight loss?: No Fatigue?: No  Skin Skin rash/lesions?: No Itching?: No  Eyes Blurred vision?: No Double vision?: No  Ears/Nose/Throat Sore throat?: No Sinus problems?: No  Hematologic/Lymphatic Swollen glands?: No Easy  bruising?: No  Cardiovascular Leg swelling?: No Chest pain?: No  Respiratory Cough?: No Shortness of breath?: No  Endocrine Excessive thirst?: No  Musculoskeletal Back pain?: No Joint pain?: No  Neurological Headaches?: No Dizziness?: No  Psychologic Depression?: No Anxiety?: No  Physical Exam: BP 126/77 (BP Location: Right Arm, Patient Position: Sitting, Cuff Size: Large)   Pulse 78   Ht _0  (1.727 m)   Wt 274 lb (124.3 kg)   BMI 41.66 kg/m   Constitutional:  Alert and oriented, No acute distress. HEENT: [place] AT, moist mucus membranes.  Trachea midline, no masses. Cardiovascular: No clubbing, cyanosis, or edema. Respiratory: Normal respiratory effort, no increased work of breathing. GI: Abdomen is soft, nontender, nondistended, no abdominal masses GU: No CVA tenderness.  Prostate difficult to palpate due to body habitus.  No obvious induration or nodules palpable on this limited exam. Skin: No rashes, bruises or suspicious lesions. Lymph: No cervical or inguinal adenopathy. Neurologic: Grossly intact, no focal deficits, moving all 4 extremities. Psychiatric: Normal mood and affect.  Laboratory Data: Lab Results  Component Value Date   WBC 4.4 01/10/2016   HGB 13.8 01/10/2016   HCT 42.4 01/10/2016   MCV 84.2 01/10/2016   PLT 193 01/10/2016    Lab Results  Component Value Date   CREATININE 0.90 01/10/2016    No results found for: PSA  No results found for: TESTOSTERONE  No results found for: HGBA1C  Urinalysis No results found for: COLORURINE, APPEARANCEUR, LABSPEC, PHURINE, GLUCOSEU, HGBUR, BILIRUBINUR, KETONESUR, PROTEINUR, UROBILINOGEN, NITRITE, LEUKOCYTESUR   Assessment & Plan:    1. History of elevated PSA The patient's PSA has normalized for approximately 2 years now, so he can continue with the standard prostate cancer screening protocol and follow-up in 1 year with PSA prior.  Return in about 1 year (around 09/26/2018) for PSA  prior.  Nickie Retort, MD  [facility] [address] [place],  [address] [phone]

## 2018-06-08 ENCOUNTER — Other Ambulatory Visit: Payer: Self-pay | Admitting: Physician Assistant

## 2018-06-08 ENCOUNTER — Other Ambulatory Visit (HOSPITAL_COMMUNITY): Payer: Self-pay | Admitting: Physician Assistant

## 2018-06-08 DIAGNOSIS — Z0000 Encounter for general adult medical examination without abnormal findings: Secondary | ICD-10-CM

## 2018-06-08 DIAGNOSIS — E782 Mixed hyperlipidemia: Secondary | ICD-10-CM

## 2018-06-08 DIAGNOSIS — I10 Essential (primary) hypertension: Secondary | ICD-10-CM

## 2018-06-08 DIAGNOSIS — E119 Type 2 diabetes mellitus without complications: Secondary | ICD-10-CM

## 2018-06-12 ENCOUNTER — Ambulatory Visit
Admission: RE | Admit: 2018-06-12 | Discharge: 2018-06-12 | Disposition: A | Discharge status: Home / Self Care | Payer: Medicare HMO | Source: Ambulatory Visit | Attending: Physician Assistant | Admitting: Physician Assistant

## 2018-06-12 DIAGNOSIS — I77811 Abdominal aortic ectasia: Secondary | ICD-10-CM | POA: Diagnosis not present

## 2018-06-12 DIAGNOSIS — E119 Type 2 diabetes mellitus without complications: Secondary | ICD-10-CM | POA: Insufficient documentation

## 2018-06-12 DIAGNOSIS — Z0000 Encounter for general adult medical examination without abnormal findings: Secondary | ICD-10-CM | POA: Diagnosis present

## 2018-06-12 DIAGNOSIS — I10 Essential (primary) hypertension: Secondary | ICD-10-CM

## 2018-06-12 DIAGNOSIS — E782 Mixed hyperlipidemia: Secondary | ICD-10-CM | POA: Insufficient documentation

## 2018-09-22 ENCOUNTER — Other Ambulatory Visit: Payer: Self-pay

## 2018-09-22 DIAGNOSIS — R972 Elevated prostate specific antigen [PSA]: Secondary | ICD-10-CM

## 2018-09-23 ENCOUNTER — Encounter: Payer: Self-pay | Admitting: Urology

## 2018-09-23 ENCOUNTER — Other Ambulatory Visit: Payer: Medicare HMO

## 2018-09-29 ENCOUNTER — Ambulatory Visit: Payer: Medicare HMO | Admitting: Urology

## 2018-09-30 ENCOUNTER — Encounter: Payer: Self-pay | Admitting: Urology

## 2019-01-15 ENCOUNTER — Other Ambulatory Visit: Payer: Self-pay

## 2019-01-15 DIAGNOSIS — Z20822 Contact with and (suspected) exposure to covid-19: Secondary | ICD-10-CM

## 2019-01-16 LAB — NOVEL CORONAVIRUS, NAA: SARS-CoV-2, NAA: NOT DETECTED

## 2019-01-19 ENCOUNTER — Telehealth: Payer: Self-pay | Admitting: General Practice

## 2019-01-19 NOTE — Telephone Encounter (Signed)
Negative COVID results given. Patient results "NOT Detected." Caller expressed understanding. ° °

## 2019-07-15 ENCOUNTER — Other Ambulatory Visit: Payer: Self-pay

## 2019-07-15 ENCOUNTER — Ambulatory Visit: Payer: Medicare HMO | Admitting: Urology

## 2019-07-15 ENCOUNTER — Encounter: Payer: Self-pay | Admitting: Urology

## 2019-07-15 VITALS — BP 142/86 | HR 67 | Ht 68.0 in | Wt 275.0 lb

## 2019-07-15 DIAGNOSIS — N5201 Erectile dysfunction due to arterial insufficiency: Secondary | ICD-10-CM | POA: Diagnosis not present

## 2019-07-15 DIAGNOSIS — R972 Elevated prostate specific antigen [PSA]: Secondary | ICD-10-CM | POA: Diagnosis not present

## 2019-07-18 ENCOUNTER — Encounter: Payer: Self-pay | Admitting: Urology

## 2019-07-18 DIAGNOSIS — N5201 Erectile dysfunction due to arterial insufficiency: Secondary | ICD-10-CM | POA: Insufficient documentation

## 2019-07-18 DIAGNOSIS — R972 Elevated prostate specific antigen [PSA]: Secondary | ICD-10-CM | POA: Insufficient documentation

## 2019-07-18 MED ORDER — SILDENAFIL CITRATE 20 MG PO TABS: ORAL_TABLET | ORAL | 0 refills | Status: DC

## 2019-07-18 NOTE — Progress Notes (Signed)
07/15/2019 8:54 AM   Ronald Chang 1950/09/29 [id number]  Referring provider: Marinda Elk, MD [place] [facility]  [place] [address]  Chief Complaint  Patient presents with  . Elevated PSA    HPI: Ronald Chang is a 69 y.o. male seen at request of Boykin Reaper for evaluation of an elevated PSA.  The was initially seen at our practice by Dr. Pilar Jarvis and January 2017 for a PSA of 4.18.  -Prostate biopsy 06/2015, volume 68 g -Benign pathology  He last saw Dr. Pilar Jarvis in 2019 and PSA was 3.19.  Last PSA January 2021 was 4.85.  PSA trend as follows: January 2021:    4.85 January 2020:    5.70 March 2019:       3.26 July 2016:       3.22 December 2015:      3.26 May 2015:     3.20 April 2015: 4.06 March 2014:     2.64   He has no bothersome lower urinary tract symptoms.  Denies dysuria, gross hematuria.  No flank, abdominal or pelvic pain.  He is more concerned about his ED.  He has a 1-2-year history of difficulty achieving and maintaining an erection.  No previous treatment.  Denies pain or curvature with erections.  Organic risk factors include hypertension, antihypertensive medications and previous tobacco history.  Other past urologic history markable for circumcision 2017  PMH: Past Medical History:  Diagnosis Date  . Anemia   . Blood glucose elevated 06/01/2015  . BP (high blood pressure) 06/01/2015  . Colon polyp 06/01/2015  . 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 Difficult intubation    Grade IV view with MAC 4, difficult 2 person mask ventilation, grade 1 view with glidescope, anterior larynx with several attempts and anterior laryngeal pressure required to pass tube through cords even with glidescope stylet  . Glaucoma 06/01/2015    Surgical History: Past Surgical History:  Procedure Laterality Date  . CIRCUMCISION N/A 01/24/2016   Procedure: CIRCUMCISION ADULT;  Surgeon: Nickie Retort,  MD;  Location: [facility] ORS;  Service: Urology;  Laterality: N/A;  . NO PAST SURGERIES      Home Medications:  Allergies as of 07/15/2019   No Known Allergies     Medication List       Accurate as of July 15, 2019 11:59 PM. If you have any questions, ask your nurse or doctor.        aspirin EC 81 MG tablet Take 81 mg by mouth daily. Reported on 06/22/2015   ferrous sulfate 325 (65 FE) MG tablet Take 325 mg by mouth daily.   Fish Oil 1000 MG Caps Take 1 capsule by mouth daily.   Garlic 622 MG Caps Take 1 capsule by mouth daily.   losartan-hydrochlorothiazide 100-12.5 MG tablet Commonly known as: HYZAAR Take 1 tablet by mouth daily.   rosuvastatin 10 MG tablet Commonly known as: CRESTOR   vitamin C 1000 MG tablet Take 1,000 mg by mouth daily.       Allergies: No Known Allergies  Family History: Family History  Problem Relation Age of Onset  . Prostate cancer Neg Hx   . Bladder Cancer Neg Hx   . Kidney cancer Neg Hx     Social History:  reports that he quit smoking about 30 years ago. His smoking use included pipe and cigars. He has quit using smokeless tobacco.  His smokeless tobacco use included chew. He reports that  he does not drink alcohol or use drugs.   Physical Exam: BP (!) 142/86   Pulse 67   Ht _0  (1.727 m)   Wt 275 lb (124.7 kg)   BMI 41.81 kg/m   Constitutional:  Alert and oriented, No acute distress. HEENT: [place] AT, moist mucus membranes.  Trachea midline, no masses. Cardiovascular: No clubbing, cyanosis, or edema. Respiratory: Normal respiratory effort, no increased work of breathing. GU: Prostate 60 g, smooth without nodules   Assessment & Plan:    - Elevated PSA 69 y.o. male with a mild PSA elevation and prior benign prostate biopsy.  PSA bumped last year to 5.7 but this year decreased 4.85.  His chances of clinically significant prostate cancer are low.  We discussed options of repeat prostate biopsy, prostate MRI or continued  surveillance.  He has elected the latter and would recommend follow-up PSA January 2022  - Erectile dysfunction He was interested in a PDE 5 inhibitor trial and Rx generic sildenafil was sent to his pharmacy.   Abbie Sons, [place] [address], [place], [place] [address] [phone]

## 2019-09-22 IMAGING — US US AORTA SCREENING (MEDICARE)
1 series · 12 of 12 positions shown · non-contrast
Comparison: None.

CLINICAL DATA: Male between 65-75 years of age with a smoking
history.

EXAM:
US ABDOMINAL AORTA MEDICARE SCREENING
TECHNIQUE: Ultrasound examination of the abdominal aorta was performed as a
screening evaluation for abdominal aortic aneurysm.

[Series 1: us aorta screening (medicare) · 12 of 12 slices shown]
[im 1/12]
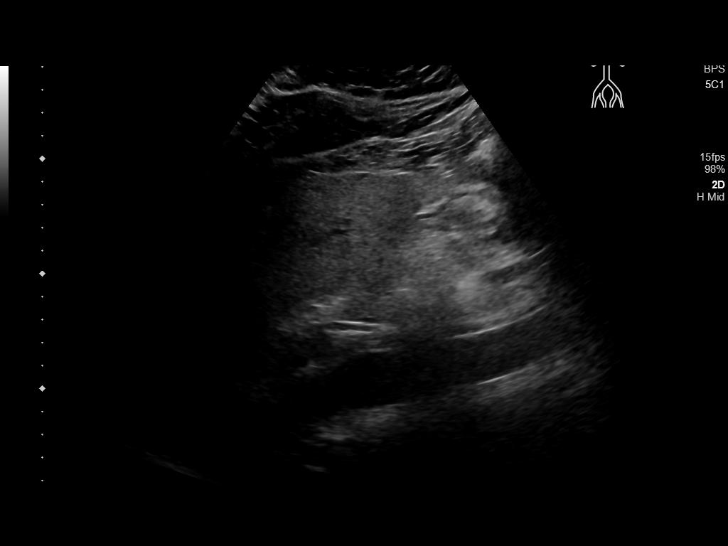
[im 2/12]
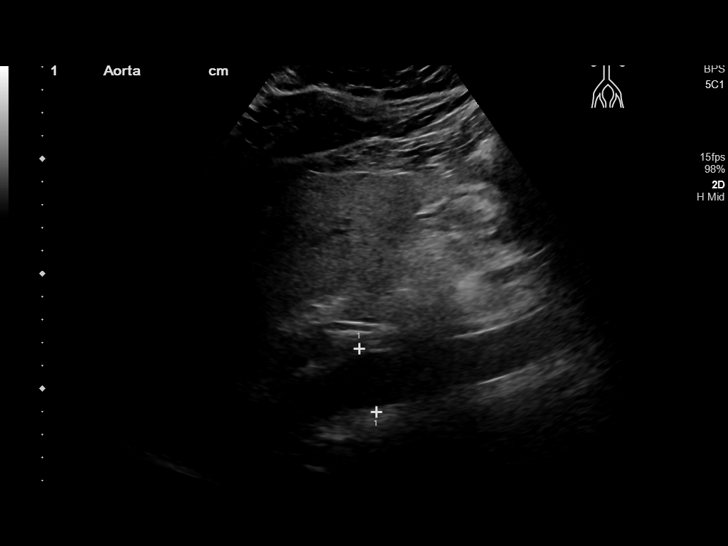
[im 3/12]
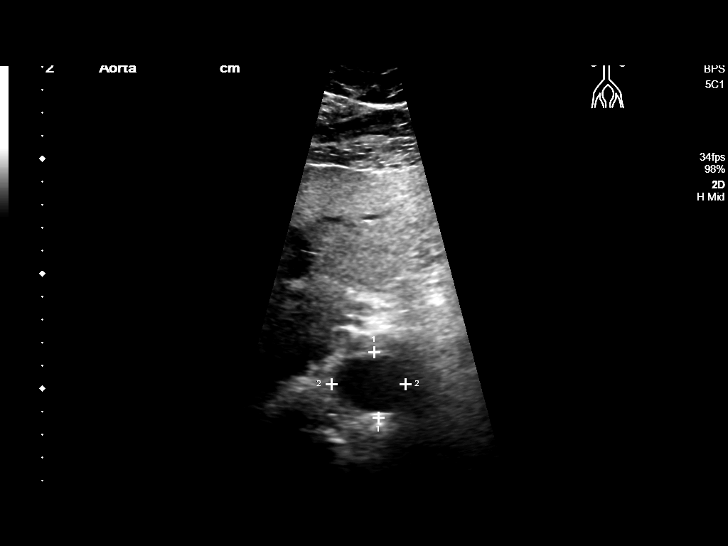
[im 4/12]
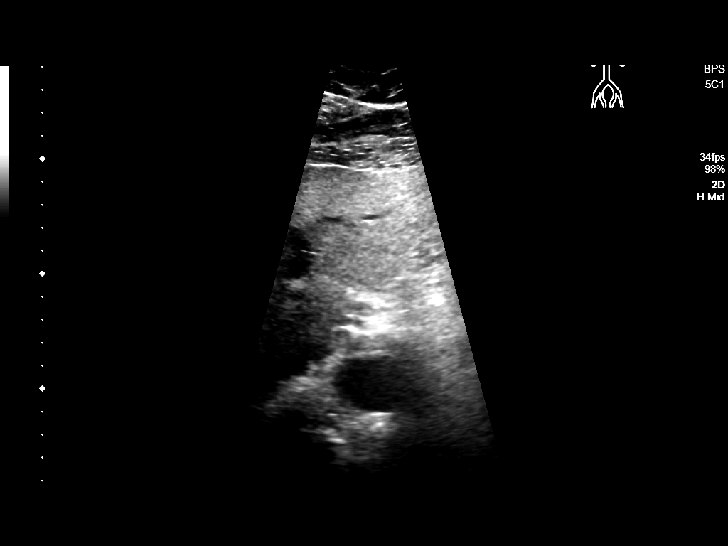
[im 5/12]
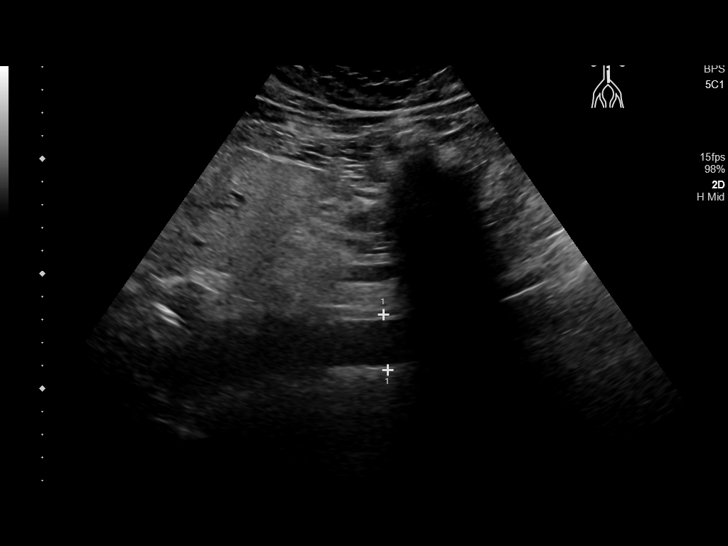
[im 6/12]
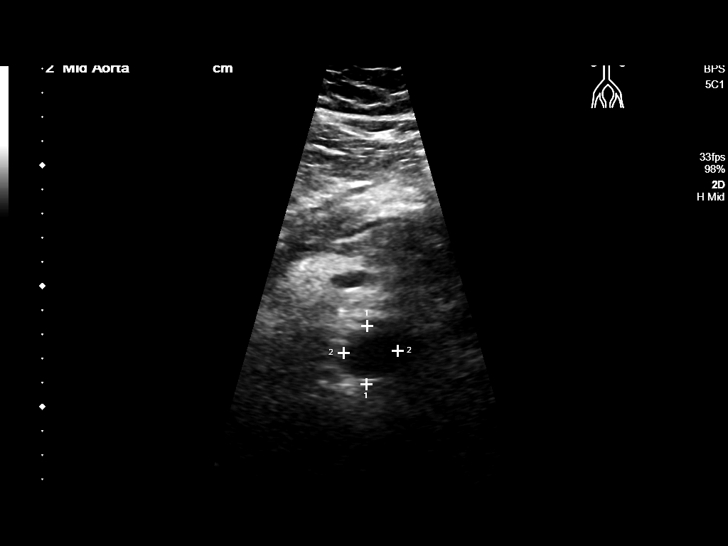
[im 7/12]
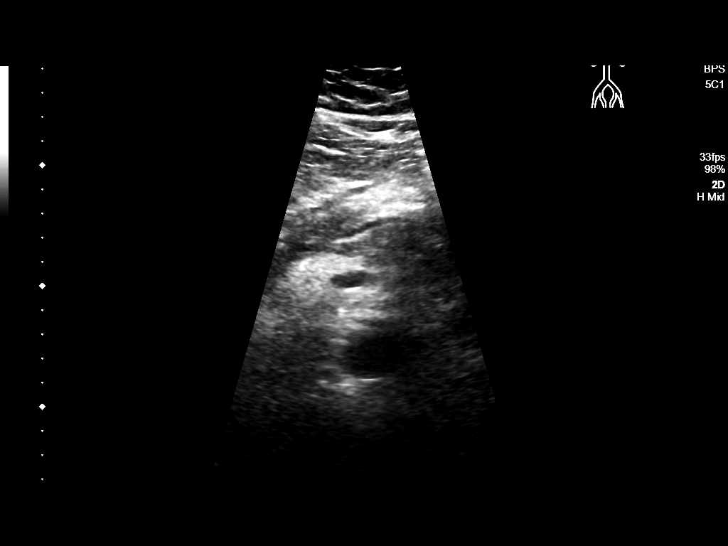
[im 8/12]
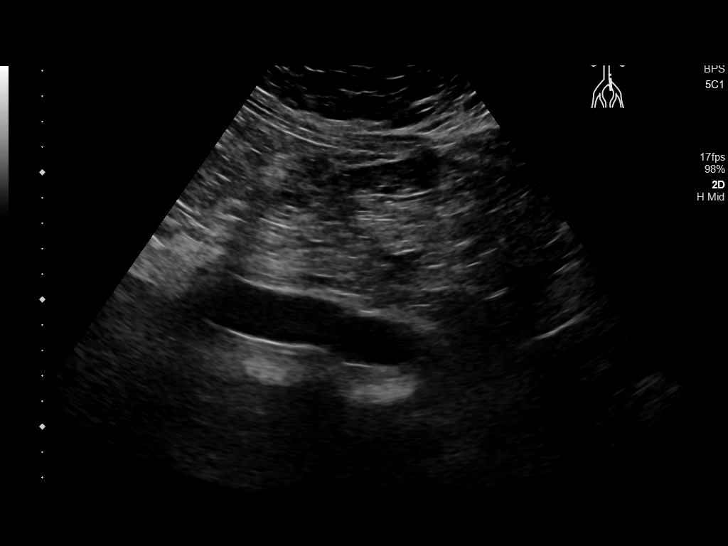
[im 9/12]
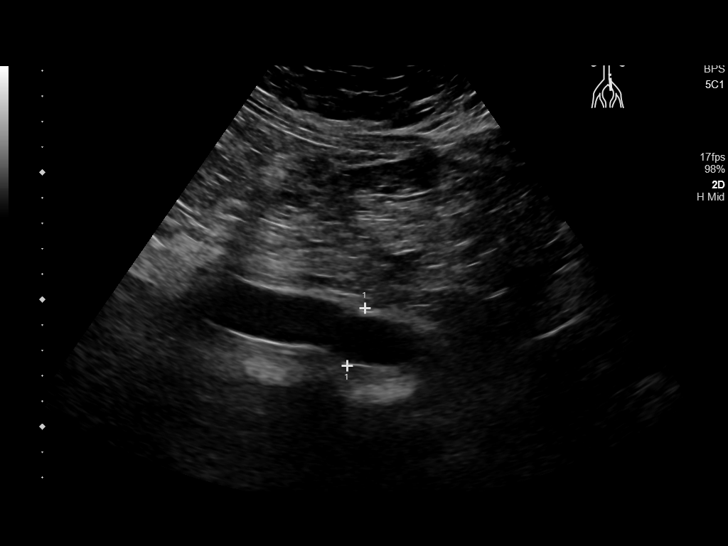
[im 10/12]
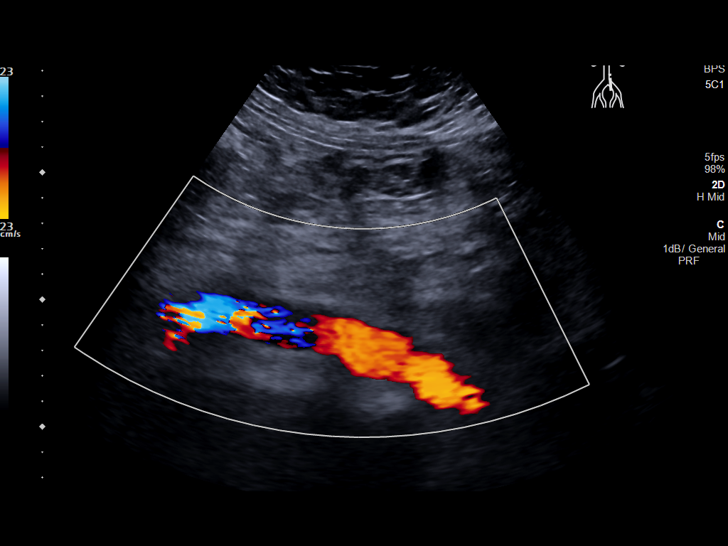
[im 11/12]
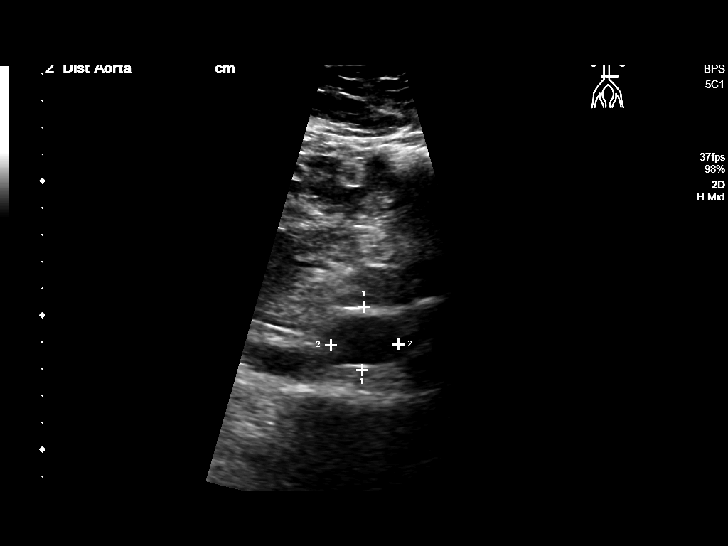
[im 12/12]
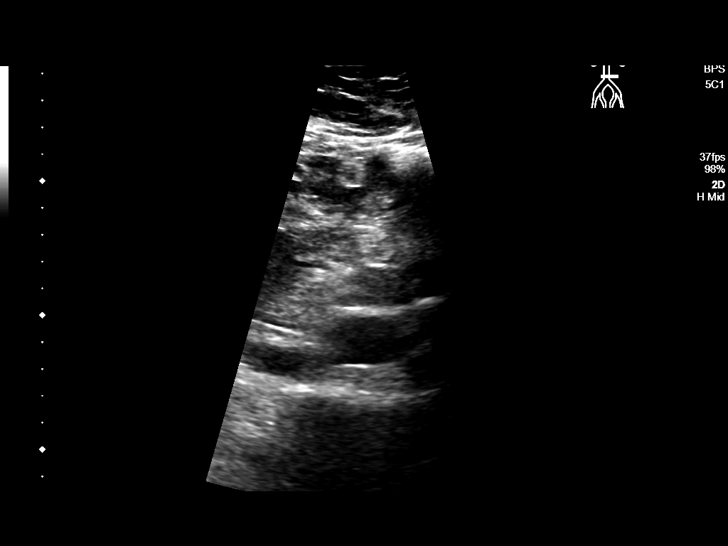

[12 of 12 positions shown; findings below may reference images not displayed]

FINDINGS: Abdominal aortic measurements as follows:

Proximal:  3.2 x 2.9 cm

Mid:  2.4 x 2.3 cm

Distal:  2.5 x 2.4 cm
IMPRESSION: Very mild ectasia of the distal aspect of the abdominal aorta
measuring 2.5 cm in diameter.

Recommend follow-up aortic ultrasound in 5 years. This
recommendation follows ACR consensus guidelines: White Paper of the
ACR Incidental Findings Committee II on Vascular Findings. [HOSPITAL] 3591; [DATE].

## 2021-06-25 DIAGNOSIS — E782 Mixed hyperlipidemia: Secondary | ICD-10-CM | POA: Diagnosis not present

## 2021-06-25 DIAGNOSIS — R7303 Prediabetes: Secondary | ICD-10-CM | POA: Diagnosis not present

## 2021-06-25 DIAGNOSIS — G8929 Other chronic pain: Secondary | ICD-10-CM | POA: Diagnosis not present

## 2021-06-25 DIAGNOSIS — M25561 Pain in right knee: Secondary | ICD-10-CM | POA: Diagnosis not present

## 2021-06-25 DIAGNOSIS — I10 Essential (primary) hypertension: Secondary | ICD-10-CM | POA: Diagnosis not present

## 2021-06-25 DIAGNOSIS — Z6841 Body Mass Index (BMI) 40.0 and over, adult: Secondary | ICD-10-CM | POA: Diagnosis not present

## 2021-06-25 DIAGNOSIS — R972 Elevated prostate specific antigen [PSA]: Secondary | ICD-10-CM | POA: Diagnosis not present

## 2021-06-25 DIAGNOSIS — Z0000 Encounter for general adult medical examination without abnormal findings: Secondary | ICD-10-CM | POA: Diagnosis not present

## 2021-06-25 DIAGNOSIS — Z125 Encounter for screening for malignant neoplasm of prostate: Secondary | ICD-10-CM | POA: Diagnosis not present

## 2021-06-25 DIAGNOSIS — M25562 Pain in left knee: Secondary | ICD-10-CM | POA: Diagnosis not present

## 2021-07-23 ENCOUNTER — Encounter: Payer: Self-pay | Admitting: Urology

## 2021-07-23 ENCOUNTER — Other Ambulatory Visit: Payer: Self-pay

## 2021-07-23 ENCOUNTER — Ambulatory Visit: Payer: Medicare HMO | Admitting: Urology

## 2021-07-23 VITALS — BP 148/92 | HR 62 | Ht 68.0 in | Wt 273.0 lb

## 2021-07-23 DIAGNOSIS — R972 Elevated prostate specific antigen [PSA]: Secondary | ICD-10-CM

## 2021-07-23 NOTE — Progress Notes (Unsigned)
07/23/2021 10:53 AM   Ronald Chang [birth year] [id number]  Referring provider: Marinda Elk, MD [facility] [place],  [place] [address]  Chief Complaint  Patient presents with   Elevated PSA    Urologic history: 1.  Elevated PSA Bx 06/2015 PSA 4.18; volume 68 g; benign pathology  HPI: 71 y.o. male presents for follow-up of an elevated PSA.  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   Difficult intubation    Grade IV view with MAC 4, difficult 2 person mask ventilation, grade 1 view with glidescope, anterior larynx with several attempts and anterior laryngeal pressure required to pass tube through cords even with glidescope stylet   Glaucoma 06/01/2015    Surgical History: Past Surgical History:  Procedure Laterality Date   CIRCUMCISION N/A 01/24/2016   Procedure: CIRCUMCISION ADULT;  Surgeon: Nickie Retort, MD;  Location: [facility] ORS;  Service: Urology;  Laterality: N/A;   NO PAST SURGERIES      Home Medications:  Allergies as of 07/23/2021   No Known Allergies      Medication List        Accurate as of July 23, 2021 10:53 AM. If you have any questions, ask your nurse or doctor.          STOP taking these medications    rosuvastatin 10 MG tablet Commonly known as: CRESTOR Stopped by: Abbie Sons, MD   sildenafil 20 MG tablet Commonly known as: REVATIO Stopped by: Abbie Sons, MD       TAKE these medications    aspirin EC 81 MG tablet Take 81 mg by mouth daily. Reported on 06/22/2015   ferrous sulfate 325 (65 FE) MG tablet Take 325 mg by mouth daily.   Fish Oil 1000 MG Caps Take 1 capsule by mouth  daily.   Garlic 989 MG Caps Take 1 capsule by mouth daily.   losartan-hydrochlorothiazide 100-12.5 MG tablet Commonly known as: HYZAAR Take 1 tablet by mouth daily.   vitamin C 1000 MG tablet Take 1,000 mg by mouth daily.        Allergies: No Known Allergies  Family History: Family History  Problem Relation Age of Onset   Prostate cancer Neg Hx    Bladder Cancer Neg Hx    Kidney cancer Neg Hx     Social History:  reports that he quit smoking about 32 years ago. His smoking use included pipe and cigars. He has quit using smokeless tobacco.  His smokeless tobacco use included chew. He reports that he does not drink alcohol and does not use drugs.   Physical Exam: BP (!) 148/92    Pulse 62    Ht _0  (1.727 m)    Wt 273 lb (123.8 kg)    BMI 41.51 kg/m   Constitutional:  Alert and oriented, No acute distress. HEENT: [place] AT, moist mucus membranes.  Trachea midline, no masses. Cardiovascular: No clubbing, cyanosis, or edema. Respiratory: Normal respiratory effort, no increased work of breathing. GI: Abdomen is soft, nontender, nondistended, no abdominal masses GU: Prostate 50+ cc, smooth without nodules.  Only lower one half palpable secondary to body habitus  Psychiatric: Normal mood and affect.   Assessment & Plan:    1.  Elevated PSA Increasing PSA level Benign DRE Recommend prostate MRI to evaluate for lesions suspicious for high-grade cancer If abnormality noted we discussed fusion biopsy If no abnormalities noted we discussed standard prostate biopsy versus surveillance which would be based on PSA density ***MRI order  Abbie Sons, MD  [place] [address] [place], [place] [address] [phone]

## 2021-08-18 ENCOUNTER — Ambulatory Visit (HOSPITAL_COMMUNITY)
Admission: RE | Admit: 2021-08-18 | Discharge: 2021-08-18 | Disposition: A | Discharge status: Home / Self Care | Payer: Medicare HMO | Source: Ambulatory Visit | Attending: Urology | Admitting: Urology

## 2021-08-18 DIAGNOSIS — K5730 Diverticulosis of large intestine without perforation or abscess without bleeding: Secondary | ICD-10-CM | POA: Diagnosis not present

## 2021-08-18 DIAGNOSIS — N402 Nodular prostate without lower urinary tract symptoms: Secondary | ICD-10-CM | POA: Diagnosis not present

## 2021-08-18 DIAGNOSIS — R972 Elevated prostate specific antigen [PSA]: Secondary | ICD-10-CM | POA: Insufficient documentation

## 2021-08-18 DIAGNOSIS — N400 Enlarged prostate without lower urinary tract symptoms: Secondary | ICD-10-CM | POA: Diagnosis not present

## 2021-08-18 MED ORDER — GADOBUTROL 1 MMOL/ML IV SOLN
10.0000 mL | Freq: Once | INTRAVENOUS | Status: AC | PRN
  Administered 2021-08-18: 10 mL via INTRAVENOUS

## 2021-08-20 ENCOUNTER — Telehealth: Payer: Self-pay | Admitting: Urology

## 2021-08-20 NOTE — Telephone Encounter (Signed)
Notified patient as instructed, patient pleased. Discussed follow-up appointments, patient agrees  

## 2021-08-20 NOTE — Telephone Encounter (Signed)
Please let patient know his prostate MRI showed no abnormalities that are suspicious for high-grade prostate cancer.  Prostate has significantly grown since his last biopsy in 2017 which is the most likely cause of the PSA rise.  I would recommend following the PSA and scheduling a follow-up visit with PSA prior August 2023.  Please let me know if he has any questions ?

## 2021-12-13 ENCOUNTER — Other Ambulatory Visit: Payer: Self-pay | Admitting: Family Medicine

## 2021-12-13 DIAGNOSIS — R972 Elevated prostate specific antigen [PSA]: Secondary | ICD-10-CM

## 2021-12-21 ENCOUNTER — Other Ambulatory Visit: Payer: Medicare HMO

## 2021-12-28 ENCOUNTER — Ambulatory Visit: Payer: Medicare HMO | Admitting: Urology

## 2022-01-03 ENCOUNTER — Other Ambulatory Visit: Payer: Medicare HMO

## 2022-01-03 ENCOUNTER — Other Ambulatory Visit: Payer: Self-pay | Admitting: *Deleted

## 2022-01-03 DIAGNOSIS — R972 Elevated prostate specific antigen [PSA]: Secondary | ICD-10-CM | POA: Diagnosis not present

## 2022-01-04 LAB — PSA: Prostate Specific Ag, Serum: 6.7 ng/mL — ABNORMAL HIGH (ref 0.0–4.0)

## 2022-01-11 ENCOUNTER — Ambulatory Visit: Payer: Medicare HMO | Admitting: Urology

## 2022-01-11 ENCOUNTER — Encounter: Payer: Self-pay | Admitting: Urology

## 2022-01-11 VITALS — BP 152/83 | HR 71 | Ht 68.0 in | Wt 272.0 lb

## 2022-01-11 DIAGNOSIS — R972 Elevated prostate specific antigen [PSA]: Secondary | ICD-10-CM | POA: Diagnosis not present

## 2022-01-11 DIAGNOSIS — N400 Enlarged prostate without lower urinary tract symptoms: Secondary | ICD-10-CM | POA: Diagnosis not present

## 2022-01-11 MED ORDER — FINASTERIDE 5 MG PO TABS: 5.0000 mg | ORAL_TABLET | Freq: Every day | ORAL | 3 refills | Status: DC

## 2022-01-11 NOTE — Progress Notes (Signed)
01/11/2022 8:43 AM   Ronald Chang [birth year] [id number]  Referring provider: Marinda Elk, MD [facility] [place],  [place] [address]  Chief Complaint  Patient presents with   Elevated PSA    Urologic history: 1.  Elevated PSA Bx 06/2015 PSA 4.18; volume 68 g; benign pathology  HPI: 71 y.o. male presents for follow-up of an elevated PSA.  Last seen in January 2021 and PSA was stable at 4.85 PSA 05/2020 5.84 and 06/2021 7.71 Urinalysis was normal No bothersome LUTS Prostate MRI 08/18/2021 with significant prostate volume 174 cc.  No lesions suspicious for high-grade cancer. 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   Difficult intubation    Grade IV view with MAC 4, difficult 2 person mask ventilation, grade 1 view with glidescope, anterior larynx with several attempts and anterior laryngeal pressure required to pass tube through cords even with glidescope stylet   Glaucoma 06/01/2015    Surgical History: Past Surgical History:  Procedure Laterality Date   CIRCUMCISION N/A 01/24/2016   Procedure: CIRCUMCISION ADULT;  Surgeon: Nickie Retort, MD;  Location: [facility] ORS;  Service: Urology;  Laterality: N/A;   NO PAST SURGERIES      Home Medications:  Allergies as of 01/11/2022   No Known Allergies      Medication List        Accurate as of January 11, 2022  8:43 AM. If you have any questions, ask your nurse or doctor.          aspirin EC 81 MG tablet Take 81 mg by mouth daily. Reported on 06/22/2015   ferrous sulfate 325 (65 FE) MG tablet Take 325 mg by mouth daily.   Fish Oil 1000 MG Caps Take 1 capsule by mouth daily.   Garlic 846 MG Caps Take 1 capsule by  mouth daily.   losartan-hydrochlorothiazide 100-12.5 MG tablet Commonly known as: HYZAAR Take 1 tablet by mouth daily.   vitamin C 1000 MG tablet Take 1,000 mg by mouth daily.        Allergies: No Known Allergies  Family History: Family History  Problem Relation Age of Onset   Prostate cancer Neg Hx    Bladder Cancer Neg Hx    Kidney cancer Neg Hx     Social History:  reports that he quit smoking about 32 years ago. His smoking use included pipe and cigars. He has quit using smokeless tobacco.  His smokeless tobacco use included chew. He reports that he does not drink alcohol and does not use drugs.   Physical Exam: BP (!) 152/83 (BP Location: Left Arm, Patient Position: Sitting, Cuff Size: Large)   Pulse 71   Ht _0  (1.727 m)   Wt 272 lb (123.4 kg)   BMI 41.36 kg/m   Constitutional:  Alert and oriented, No acute distress. HEENT: [place] AT, moist mucus membranes.  Trachea midline, no masses. Cardiovascular: No clubbing, cyanosis, or edema. Respiratory: Normal respiratory effort, no increased work of breathing. GI: Abdomen is soft, nontender, nondistended, no abdominal masses GU: Prostate 50+ cc, smooth without nodules.  Only lower one half palpable secondary to body habitus Psychiatric: Normal mood and affect.   Assessment & Plan:  1.  Elevated PSA Prostate MRI with significant increase in prostate volume and no lesions suspicious for high-grade prostate cancer PSA has decreased and is most likely secondary to BPH/inflammation Continue monitoring and 63-monthfollow-up with a PSA  2.  BPH without LUTS Significant prostate volume 174 g. We discussed starting a 5-ARI which will reduce his prostate size by ~ 30% and potentially prevent further prostate growth.  We discussed this would be more preventative since he does not have lower urinary tract symptoms.  The option of continued monitoring without medication was discussed He would like to start finasteride and [place]  [place] [facility] [place] [facility] [id number] [place] [phone]

## 2022-01-24 DIAGNOSIS — Z0000 Encounter for general adult medical examination without abnormal findings: Secondary | ICD-10-CM | POA: Diagnosis not present

## 2022-01-24 DIAGNOSIS — I10 Essential (primary) hypertension: Secondary | ICD-10-CM | POA: Diagnosis not present

## 2022-01-24 DIAGNOSIS — R059 Cough, unspecified: Secondary | ICD-10-CM | POA: Diagnosis not present

## 2022-01-24 DIAGNOSIS — E782 Mixed hyperlipidemia: Secondary | ICD-10-CM | POA: Diagnosis not present

## 2022-01-24 DIAGNOSIS — E119 Type 2 diabetes mellitus without complications: Secondary | ICD-10-CM | POA: Diagnosis not present

## 2022-01-24 DIAGNOSIS — R053 Chronic cough: Secondary | ICD-10-CM | POA: Diagnosis not present

## 2022-01-24 DIAGNOSIS — R7303 Prediabetes: Secondary | ICD-10-CM | POA: Diagnosis not present

## 2022-01-24 DIAGNOSIS — R058 Other specified cough: Secondary | ICD-10-CM | POA: Diagnosis not present

## 2022-02-19 ENCOUNTER — Other Ambulatory Visit: Payer: Medicare HMO

## 2022-03-21 DIAGNOSIS — H2512 Age-related nuclear cataract, left eye: Secondary | ICD-10-CM | POA: Diagnosis not present

## 2022-03-21 DIAGNOSIS — H35033 Hypertensive retinopathy, bilateral: Secondary | ICD-10-CM | POA: Diagnosis not present

## 2022-03-21 DIAGNOSIS — Z0100 Encounter for examination of eyes and vision without abnormal findings: Secondary | ICD-10-CM | POA: Diagnosis not present

## 2022-03-21 DIAGNOSIS — H40013 Open angle with borderline findings, low risk, bilateral: Secondary | ICD-10-CM | POA: Diagnosis not present

## 2022-07-09 DIAGNOSIS — J40 Bronchitis, not specified as acute or chronic: Secondary | ICD-10-CM | POA: Diagnosis not present

## 2022-07-12 ENCOUNTER — Other Ambulatory Visit: Payer: Medicare HMO

## 2022-07-17 ENCOUNTER — Ambulatory Visit: Payer: Medicare HMO | Admitting: Urology

## 2022-07-30 ENCOUNTER — Other Ambulatory Visit: Payer: Self-pay

## 2022-07-31 DIAGNOSIS — I10 Essential (primary) hypertension: Secondary | ICD-10-CM | POA: Diagnosis not present

## 2022-07-31 DIAGNOSIS — E119 Type 2 diabetes mellitus without complications: Secondary | ICD-10-CM | POA: Diagnosis not present

## 2022-07-31 DIAGNOSIS — R053 Chronic cough: Secondary | ICD-10-CM | POA: Diagnosis not present

## 2022-07-31 DIAGNOSIS — Z23 Encounter for immunization: Secondary | ICD-10-CM | POA: Diagnosis not present

## 2022-07-31 DIAGNOSIS — Z6841 Body Mass Index (BMI) 40.0 and over, adult: Secondary | ICD-10-CM | POA: Diagnosis not present

## 2022-07-31 DIAGNOSIS — Z0000 Encounter for general adult medical examination without abnormal findings: Secondary | ICD-10-CM | POA: Diagnosis not present

## 2022-07-31 DIAGNOSIS — R7303 Prediabetes: Secondary | ICD-10-CM | POA: Diagnosis not present

## 2022-07-31 DIAGNOSIS — R972 Elevated prostate specific antigen [PSA]: Secondary | ICD-10-CM | POA: Diagnosis not present

## 2022-07-31 DIAGNOSIS — E782 Mixed hyperlipidemia: Secondary | ICD-10-CM | POA: Diagnosis not present

## 2022-08-13 DIAGNOSIS — R609 Edema, unspecified: Secondary | ICD-10-CM | POA: Diagnosis not present

## 2022-08-13 DIAGNOSIS — R053 Chronic cough: Secondary | ICD-10-CM | POA: Diagnosis not present

## 2022-08-15 ENCOUNTER — Other Ambulatory Visit: Payer: Self-pay

## 2022-08-15 DIAGNOSIS — R609 Edema, unspecified: Secondary | ICD-10-CM

## 2022-08-19 ENCOUNTER — Ambulatory Visit
Admission: RE | Admit: 2022-08-19 | Discharge: 2022-08-19 | Disposition: A | Discharge status: Home / Self Care | Payer: Medicare HMO | Source: Ambulatory Visit | Attending: Pulmonary Disease | Admitting: Pulmonary Disease

## 2022-08-19 DIAGNOSIS — K7460 Unspecified cirrhosis of liver: Secondary | ICD-10-CM | POA: Diagnosis not present

## 2022-08-19 DIAGNOSIS — K7581 Nonalcoholic steatohepatitis (NASH): Secondary | ICD-10-CM | POA: Diagnosis not present

## 2022-08-19 DIAGNOSIS — R609 Edema, unspecified: Secondary | ICD-10-CM | POA: Diagnosis not present

## 2022-08-19 DIAGNOSIS — K760 Fatty (change of) liver, not elsewhere classified: Secondary | ICD-10-CM | POA: Diagnosis not present

## 2022-08-23 DIAGNOSIS — R600 Localized edema: Secondary | ICD-10-CM | POA: Diagnosis not present

## 2022-08-27 DIAGNOSIS — R053 Chronic cough: Secondary | ICD-10-CM | POA: Diagnosis not present

## 2022-08-27 DIAGNOSIS — J811 Chronic pulmonary edema: Secondary | ICD-10-CM | POA: Diagnosis not present

## 2022-09-02 ENCOUNTER — Other Ambulatory Visit: Payer: Self-pay | Admitting: Pulmonary Disease

## 2022-09-02 DIAGNOSIS — R609 Edema, unspecified: Secondary | ICD-10-CM

## 2022-09-05 ENCOUNTER — Ambulatory Visit: Payer: Medicare HMO

## 2023-01-09 ENCOUNTER — Other Ambulatory Visit: Payer: Self-pay | Admitting: Urology

## 2023-01-09 DIAGNOSIS — R972 Elevated prostate specific antigen [PSA]: Secondary | ICD-10-CM

## 2023-01-09 DIAGNOSIS — N400 Enlarged prostate without lower urinary tract symptoms: Secondary | ICD-10-CM

## 2023-01-21 ENCOUNTER — Telehealth: Payer: Self-pay | Admitting: Urology

## 2023-01-21 ENCOUNTER — Other Ambulatory Visit: Payer: Medicare HMO

## 2023-01-21 ENCOUNTER — Other Ambulatory Visit: Payer: Self-pay

## 2023-01-21 ENCOUNTER — Other Ambulatory Visit: Payer: Self-pay | Admitting: *Deleted

## 2023-01-21 DIAGNOSIS — R972 Elevated prostate specific antigen [PSA]: Secondary | ICD-10-CM | POA: Diagnosis not present

## 2023-01-21 DIAGNOSIS — N400 Enlarged prostate without lower urinary tract symptoms: Secondary | ICD-10-CM | POA: Diagnosis not present

## 2023-01-21 MED ORDER — FINASTERIDE 5 MG PO TABS
5.0000 mg | ORAL_TABLET | Freq: Every day | ORAL | 3 refills | Status: DC
Start: 2023-01-21 — End: 2023-01-23

## 2023-01-21 NOTE — Telephone Encounter (Signed)
Medication sent ,advise patient.

## 2023-01-21 NOTE — Telephone Encounter (Signed)
Pt came in today to r/s appts he had missed.  He had labs drawn and has f/u w/[facility] this Thursday.  He is completely out of Finasteride and would like a week worth sent to [facility] on [address].

## 2023-01-22 LAB — PSA: Prostate Specific Ag, Serum: 2.7 ng/mL (ref 0.0–4.0)

## 2023-01-23 ENCOUNTER — Ambulatory Visit: Payer: Medicare HMO | Admitting: Urology

## 2023-01-23 ENCOUNTER — Encounter: Payer: Self-pay | Admitting: Urology

## 2023-01-23 DIAGNOSIS — R972 Elevated prostate specific antigen [PSA]: Secondary | ICD-10-CM

## 2023-01-23 DIAGNOSIS — N400 Enlarged prostate without lower urinary tract symptoms: Secondary | ICD-10-CM

## 2023-01-23 MED ORDER — FINASTERIDE 5 MG PO TABS
5.0000 mg | ORAL_TABLET | Freq: Every day | ORAL | 3 refills | Status: DC
Start: 2023-01-23 — End: 2024-03-24

## 2023-01-23 NOTE — Progress Notes (Signed)
I,Dina M Abdulla,acting as a scribe for Riki Altes, MD.,have documented all relevant documentation on the behalf of Riki Altes, MD,as directed by  Riki Altes, MD while in the presence of Riki Altes, MD.  01/23/2023 9:59 AM   Ronald Chang 1951-02-22 [id number]  Referring provider: Patrice Paradise, MD [address] [facility]  [place] [address]  Chief Complaint  Patient presents with   Elevated PSA    Urologic history:  1.  Elevated PSA Bx 06/2015 PSA 4.18; volume 68 g; benign pathology. PSA bump to 7.7 06/2021; MRI 08/2021 with prostate volume 174 cc and no lesions suspicious for high grade prostate cancer. Started Finasteride 12/2021 based on prostate volume.  2. BPH without LUTS As above   HPI: 72 y.o. male presents annual for follow-up of an elevated PSA.  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   Difficult intubation    Grade IV view with MAC 4, difficult 2 person mask ventilation, grade 1 view with glidescope, anterior larynx with several attempts and anterior laryngeal pressure required to pass tube through cords even with glidescope stylet   Glaucoma 06/01/2015    Surgical History: Past Surgical History:  Procedure Laterality Date   CIRCUMCISION N/A 01/24/2016   Procedure: CIRCUMCISION ADULT;  Surgeon: Hildred Laser, MD;  Location: [facility] ORS;  Service: Urology;  Laterality: N/A;   NO PAST SURGERIES      Home Medications:  Allergies as of 01/23/2023   No Known Allergies      Medication List        Accurate as of January 23, 2023  9:59 AM. If you have any questions, ask your nurse or  doctor.          aspirin EC 81 MG tablet Take 81 mg by mouth daily. Reported on 06/22/2015   ferrous sulfate 325 (65 FE) MG tablet Take 325 mg by mouth daily.   finasteride 5 MG tablet Commonly known as: PROSCAR Take 1 tablet (5 mg total) by mouth daily.   Fish Oil 1000 MG Caps Take 1 capsule by mouth daily.   fluticasone-salmeterol 100-50 MCG/ACT Aepb Commonly known as: ADVAIR Inhale into the lungs.   Garlic 705 MG Caps Take 1 capsule by mouth daily.   losartan-hydrochlorothiazide 100-12.5 MG tablet Commonly known as: HYZAAR Take 1 tablet by mouth daily.   vitamin C 1000 MG tablet Take 1,000 mg by mouth daily.         Family History: Family History  Problem Relation Age of Onset   Prostate cancer Neg Hx    Bladder Cancer Neg Hx    Kidney cancer Neg Hx     Social History:  reports that he quit smoking about 33 years ago. His smoking use included pipe and cigars. He has quit using smokeless tobacco.  His smokeless tobacco use included chew. He reports that he does not drink alcohol and does not use drugs.   Physical Exam: BP 126/71   Pulse 76   Ht 5\' 8"  (1.727 m)   Wt 272 lb (123.4 kg)   BMI 41.36 kg/m   Constitutional:  Alert and oriented, No acute  distress. HEENT: [place] AT, moist mucus membranes.  Trachea midline, no masses. Cardiovascular: No clubbing, cyanosis, or edema. Respiratory: Normal respiratory effort, no increased work of breathing. GI: Abdomen is soft, nontender, nondistended, no abdominal masses Psychiatric: Normal mood and affect.   Assessment & Plan:    1.  Elevated PSA Appropriate PSA decrease with the addition of Finasteride. Continue annual follow up.  2. BPH without LUTS Finasteride refilled  I have reviewed the above documentation for accuracy and completeness, and I agree with the above.   Riki Altes, MD  [facility] [address], [address] [place], [place] [address] [phone]

## 2023-01-24 ENCOUNTER — Encounter: Payer: Self-pay | Admitting: Urology

## 2023-02-04 DIAGNOSIS — G8929 Other chronic pain: Secondary | ICD-10-CM | POA: Diagnosis not present

## 2023-02-04 DIAGNOSIS — I10 Essential (primary) hypertension: Secondary | ICD-10-CM | POA: Diagnosis not present

## 2023-02-04 DIAGNOSIS — Z6841 Body Mass Index (BMI) 40.0 and over, adult: Secondary | ICD-10-CM | POA: Diagnosis not present

## 2023-02-04 DIAGNOSIS — E782 Mixed hyperlipidemia: Secondary | ICD-10-CM | POA: Diagnosis not present

## 2023-02-04 DIAGNOSIS — M545 Low back pain, unspecified: Secondary | ICD-10-CM | POA: Diagnosis not present

## 2023-02-04 DIAGNOSIS — R972 Elevated prostate specific antigen [PSA]: Secondary | ICD-10-CM | POA: Diagnosis not present

## 2023-02-04 DIAGNOSIS — E119 Type 2 diabetes mellitus without complications: Secondary | ICD-10-CM | POA: Diagnosis not present

## 2023-02-26 DIAGNOSIS — J45991 Cough variant asthma: Secondary | ICD-10-CM | POA: Diagnosis not present

## 2023-03-26 DIAGNOSIS — H40013 Open angle with borderline findings, low risk, bilateral: Secondary | ICD-10-CM | POA: Diagnosis not present

## 2023-03-26 DIAGNOSIS — H2513 Age-related nuclear cataract, bilateral: Secondary | ICD-10-CM | POA: Diagnosis not present

## 2023-03-26 DIAGNOSIS — H35033 Hypertensive retinopathy, bilateral: Secondary | ICD-10-CM | POA: Diagnosis not present

## 2023-05-29 DIAGNOSIS — H9192 Unspecified hearing loss, left ear: Secondary | ICD-10-CM | POA: Diagnosis not present

## 2023-05-29 DIAGNOSIS — M19012 Primary osteoarthritis, left shoulder: Secondary | ICD-10-CM | POA: Diagnosis not present

## 2023-05-29 DIAGNOSIS — H9312 Tinnitus, left ear: Secondary | ICD-10-CM | POA: Diagnosis not present

## 2023-05-29 DIAGNOSIS — M25512 Pain in left shoulder: Secondary | ICD-10-CM | POA: Diagnosis not present

## 2023-05-29 DIAGNOSIS — G8929 Other chronic pain: Secondary | ICD-10-CM | POA: Diagnosis not present

## 2023-06-10 DIAGNOSIS — H90A22 Sensorineural hearing loss, unilateral, left ear, with restricted hearing on the contralateral side: Secondary | ICD-10-CM | POA: Diagnosis not present

## 2023-06-25 DIAGNOSIS — H90A22 Sensorineural hearing loss, unilateral, left ear, with restricted hearing on the contralateral side: Secondary | ICD-10-CM | POA: Diagnosis not present

## 2023-06-27 ENCOUNTER — Other Ambulatory Visit: Payer: Self-pay | Admitting: Student

## 2023-06-27 DIAGNOSIS — H90A22 Sensorineural hearing loss, unilateral, left ear, with restricted hearing on the contralateral side: Secondary | ICD-10-CM

## 2023-07-15 ENCOUNTER — Ambulatory Visit
Admission: RE | Admit: 2023-07-15 | Discharge: 2023-07-15 | Disposition: A | Discharge status: Home / Self Care | Payer: Medicare HMO | Source: Ambulatory Visit | Attending: Student

## 2023-07-15 DIAGNOSIS — H90A22 Sensorineural hearing loss, unilateral, left ear, with restricted hearing on the contralateral side: Secondary | ICD-10-CM

## 2023-07-15 MED ORDER — GADOPICLENOL 0.5 MMOL/ML IV SOLN
10.0000 mL | Freq: Once | INTRAVENOUS | Status: AC | PRN
  Administered 2023-07-15: 10 mL via INTRAVENOUS

## 2023-07-23 DIAGNOSIS — H90A22 Sensorineural hearing loss, unilateral, left ear, with restricted hearing on the contralateral side: Secondary | ICD-10-CM | POA: Diagnosis not present

## 2023-08-07 DIAGNOSIS — E782 Mixed hyperlipidemia: Secondary | ICD-10-CM | POA: Diagnosis not present

## 2023-08-07 DIAGNOSIS — I498 Other specified cardiac arrhythmias: Secondary | ICD-10-CM | POA: Diagnosis not present

## 2023-08-07 DIAGNOSIS — Z1211 Encounter for screening for malignant neoplasm of colon: Secondary | ICD-10-CM | POA: Diagnosis not present

## 2023-08-07 DIAGNOSIS — Z23 Encounter for immunization: Secondary | ICD-10-CM | POA: Diagnosis not present

## 2023-08-07 DIAGNOSIS — N400 Enlarged prostate without lower urinary tract symptoms: Secondary | ICD-10-CM | POA: Diagnosis not present

## 2023-08-07 DIAGNOSIS — H0011 Chalazion right upper eyelid: Secondary | ICD-10-CM | POA: Diagnosis not present

## 2023-08-07 DIAGNOSIS — E119 Type 2 diabetes mellitus without complications: Secondary | ICD-10-CM | POA: Diagnosis not present

## 2023-08-07 DIAGNOSIS — Z0000 Encounter for general adult medical examination without abnormal findings: Secondary | ICD-10-CM | POA: Diagnosis not present

## 2023-08-07 DIAGNOSIS — I10 Essential (primary) hypertension: Secondary | ICD-10-CM | POA: Diagnosis not present

## 2024-01-16 ENCOUNTER — Other Ambulatory Visit

## 2024-01-20 ENCOUNTER — Other Ambulatory Visit: Payer: Self-pay

## 2024-01-20 ENCOUNTER — Ambulatory Visit: Admitting: Urology

## 2024-01-22 ENCOUNTER — Ambulatory Visit: Payer: Self-pay | Admitting: Urology

## 2024-01-23 ENCOUNTER — Ambulatory Visit: Admitting: Urology

## 2024-02-10 DIAGNOSIS — I10 Essential (primary) hypertension: Secondary | ICD-10-CM | POA: Diagnosis not present

## 2024-02-10 DIAGNOSIS — M25511 Pain in right shoulder: Secondary | ICD-10-CM | POA: Diagnosis not present

## 2024-02-10 DIAGNOSIS — Z0000 Encounter for general adult medical examination without abnormal findings: Secondary | ICD-10-CM | POA: Diagnosis not present

## 2024-02-10 DIAGNOSIS — Z6841 Body Mass Index (BMI) 40.0 and over, adult: Secondary | ICD-10-CM | POA: Diagnosis not present

## 2024-02-10 DIAGNOSIS — E782 Mixed hyperlipidemia: Secondary | ICD-10-CM | POA: Diagnosis not present

## 2024-02-10 DIAGNOSIS — R002 Palpitations: Secondary | ICD-10-CM | POA: Diagnosis not present

## 2024-02-10 DIAGNOSIS — M791 Myalgia, unspecified site: Secondary | ICD-10-CM | POA: Diagnosis not present

## 2024-02-10 DIAGNOSIS — M25512 Pain in left shoulder: Secondary | ICD-10-CM | POA: Diagnosis not present

## 2024-02-10 DIAGNOSIS — E119 Type 2 diabetes mellitus without complications: Secondary | ICD-10-CM | POA: Diagnosis not present

## 2024-02-10 DIAGNOSIS — E66813 Obesity, class 3: Secondary | ICD-10-CM | POA: Diagnosis not present

## 2024-03-05 DIAGNOSIS — J45991 Cough variant asthma: Secondary | ICD-10-CM | POA: Diagnosis not present

## 2024-03-11 DIAGNOSIS — J4540 Moderate persistent asthma, uncomplicated: Secondary | ICD-10-CM | POA: Diagnosis not present

## 2024-03-11 DIAGNOSIS — R0683 Snoring: Secondary | ICD-10-CM | POA: Diagnosis not present

## 2024-03-11 DIAGNOSIS — Z6841 Body Mass Index (BMI) 40.0 and over, adult: Secondary | ICD-10-CM | POA: Diagnosis not present

## 2024-03-15 ENCOUNTER — Other Ambulatory Visit: Payer: Self-pay | Admitting: Urology

## 2024-03-15 DIAGNOSIS — R972 Elevated prostate specific antigen [PSA]: Secondary | ICD-10-CM

## 2024-03-15 DIAGNOSIS — N400 Enlarged prostate without lower urinary tract symptoms: Secondary | ICD-10-CM

## 2024-03-16 ENCOUNTER — Other Ambulatory Visit: Payer: Self-pay | Admitting: Urology

## 2024-03-16 DIAGNOSIS — N400 Enlarged prostate without lower urinary tract symptoms: Secondary | ICD-10-CM

## 2024-03-16 DIAGNOSIS — R972 Elevated prostate specific antigen [PSA]: Secondary | ICD-10-CM

## 2024-03-16 NOTE — Telephone Encounter (Signed)
 Pt called to see why his RX was denied, I informed him that he has not been seen in a yr and needs to sch an appt. His appt has been sch for next Monday labs and Wed office visit with you. He would like to know if he can get it refilled now that he has appt sch. I told him that it would be up to the provider. He can be reached at [phone].

## 2024-03-17 ENCOUNTER — Other Ambulatory Visit

## 2024-03-19 ENCOUNTER — Other Ambulatory Visit: Payer: Self-pay

## 2024-03-19 DIAGNOSIS — R972 Elevated prostate specific antigen [PSA]: Secondary | ICD-10-CM

## 2024-03-22 ENCOUNTER — Other Ambulatory Visit

## 2024-03-22 DIAGNOSIS — R972 Elevated prostate specific antigen [PSA]: Secondary | ICD-10-CM

## 2024-03-22 DIAGNOSIS — E785 Hyperlipidemia, unspecified: Secondary | ICD-10-CM | POA: Diagnosis not present

## 2024-03-23 LAB — PSA: Prostate Specific Ag, Serum: 3.5 ng/mL (ref 0.0–4.0)

## 2024-03-24 ENCOUNTER — Encounter: Payer: Self-pay | Admitting: Urology

## 2024-03-24 ENCOUNTER — Ambulatory Visit: Admitting: Urology

## 2024-03-24 DIAGNOSIS — N400 Enlarged prostate without lower urinary tract symptoms: Secondary | ICD-10-CM | POA: Diagnosis not present

## 2024-03-24 DIAGNOSIS — R972 Elevated prostate specific antigen [PSA]: Secondary | ICD-10-CM

## 2024-03-24 MED ORDER — FINASTERIDE 5 MG PO TABS
5.0000 mg | ORAL_TABLET | Freq: Every day | ORAL | 3 refills | Status: AC
Start: 2024-03-24 — End: ?

## 2024-03-24 NOTE — Progress Notes (Signed)
 03/24/2024 5:05 PM   Ronald Chang 1950-09-11 [id number]  Referring provider: Marikay Eva POUR, PA [address] [facility]  [name]  Chief Complaint  Patient presents with   Follow-up   Medication Refill   Urologic history:   1.  Elevated PSA Bx 06/2015 PSA 4.18; volume 68 g; benign pathology. PSA bump to 7.7 06/2021; MRI 08/2021 with prostate volume 174 cc and no lesions suspicious for high grade prostate cancer. Started Finasteride  12/2021 based on prostate volume.   2. BPH without LUTS As above  HPI: Ronald Chang is a 72 y.o. 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   Difficult intubation    Grade IV view with MAC 4, difficult 2 person mask ventilation, grade 1 view with glidescope, anterior larynx with several attempts and anterior laryngeal pressure required to pass tube through cords even with glidescope stylet   Glaucoma 06/01/2015    Surgical History: Past Surgical History:  Procedure Laterality Date   CIRCUMCISION N/A 01/24/2016   Procedure: CIRCUMCISION ADULT;  Surgeon: Redell Lynwood Napoleon, MD;  Location: [facility] ORS;  Service: Urology;  Laterality: N/A;   NO PAST SURGERIES      Home Medications:  Allergies as of 03/24/2024   No Known Allergies      Medication List        Accurate as of March 24, 2024  5:05 PM. If you have any questions, ask your nurse  or doctor.          aspirin EC 81 MG tablet Take 81 mg by mouth daily. Reported on 06/22/2015   ferrous sulfate 325 (65 FE) MG tablet Take 325 mg by mouth daily.   finasteride  5 MG tablet Commonly known as: PROSCAR  Take 1 tablet (5 mg total) by mouth daily.   Fish Oil 1000 MG Caps Take 1 capsule by mouth daily.   fluticasone-salmeterol 100-50 MCG/ACT Aepb Commonly known as: ADVAIR Inhale into the lungs.   Garlic 705 MG Caps Take 1 capsule by mouth daily.   losartan-hydrochlorothiazide 100-12.5 MG tablet Commonly known as: HYZAAR Take 1 tablet by mouth daily.   vitamin C 1000 MG tablet Take 1,000 mg by mouth daily.        Allergies: No Known Allergies  Family History: Family History  Problem Relation Age of Onset   Prostate cancer Neg Hx    Bladder Cancer Neg Hx    Kidney cancer Neg Hx     Social History:  reports that he quit smoking about 34 years ago. His smoking use included pipe and cigars. He has quit using smokeless tobacco.  His smokeless tobacco use included chew. He reports that he does not drink alcohol and does not use drugs.   Physical Exam: BP (!) 151/95   Pulse 76   Ht 5' 8 (1.727 m)   Wt 272 lb (123.4 kg)   BMI  41.36 kg/m   Constitutional:  Alert, No acute distress. HEENT: [place] AT Respiratory: Normal respiratory effort, no increased work of breathing. Psychiatric: Normal mood and affect.   Assessment & Plan:    1.  Elevated PSA Stable uncorrected PSA Continue annual follow up.   2. BPH without LUTS Stable Finasteride  refilled   Ronald JAYSON Barba, MD  [facility] [address] [place], [name] [phone]

## 2024-03-29 DIAGNOSIS — H35033 Hypertensive retinopathy, bilateral: Secondary | ICD-10-CM | POA: Diagnosis not present

## 2024-03-29 DIAGNOSIS — H40023 Open angle with borderline findings, high risk, bilateral: Secondary | ICD-10-CM | POA: Diagnosis not present

## 2024-03-29 DIAGNOSIS — H35413 Lattice degeneration of retina, bilateral: Secondary | ICD-10-CM | POA: Diagnosis not present

## 2024-03-29 DIAGNOSIS — H2513 Age-related nuclear cataract, bilateral: Secondary | ICD-10-CM | POA: Diagnosis not present

## 2025-03-21 ENCOUNTER — Other Ambulatory Visit

## 2025-03-25 ENCOUNTER — Ambulatory Visit: Admitting: Urology
